# Patient Record
Sex: Female | Born: 1955 | Race: White | Hispanic: No | Marital: Married | Smoking: Current every day smoker
Health system: Southern US, Community
[De-identification: ages and names within clinical notes are randomized; demographics above are authoritative.]

## PROBLEM LIST (undated history)

## (undated) DIAGNOSIS — K219 Gastro-esophageal reflux disease without esophagitis: Secondary | ICD-10-CM

## (undated) DIAGNOSIS — M069 Rheumatoid arthritis, unspecified: Secondary | ICD-10-CM

## (undated) DIAGNOSIS — H409 Unspecified glaucoma: Secondary | ICD-10-CM

## (undated) DIAGNOSIS — M1990 Unspecified osteoarthritis, unspecified site: Secondary | ICD-10-CM

## (undated) DIAGNOSIS — R609 Edema, unspecified: Secondary | ICD-10-CM

## (undated) HISTORY — PX: TONSILLECTOMY: SUR1361

## (undated) HISTORY — PX: CARPAL TUNNEL RELEASE: SHX101

## (undated) HISTORY — PX: PARTIAL HYSTERECTOMY: SHX80

---

## 2000-05-29 ENCOUNTER — Other Ambulatory Visit: Admission: RE | Admit: 2000-05-29 | Discharge: 2000-05-29 | Payer: Self-pay | Admitting: Gynecology

## 2001-04-16 ENCOUNTER — Encounter: Payer: Self-pay | Admitting: Family Medicine

## 2001-04-16 ENCOUNTER — Encounter: Admission: RE | Admit: 2001-04-16 | Discharge: 2001-04-16 | Payer: Self-pay | Admitting: Family Medicine

## 2002-03-02 ENCOUNTER — Encounter: Payer: Self-pay | Admitting: Family Medicine

## 2002-03-02 ENCOUNTER — Encounter: Admission: RE | Admit: 2002-03-02 | Discharge: 2002-03-02 | Payer: Self-pay | Admitting: Family Medicine

## 2002-09-20 ENCOUNTER — Encounter: Payer: Self-pay | Admitting: Family Medicine

## 2002-09-20 ENCOUNTER — Encounter: Admission: RE | Admit: 2002-09-20 | Discharge: 2002-09-20 | Payer: Self-pay | Admitting: Family Medicine

## 2003-11-07 ENCOUNTER — Other Ambulatory Visit: Admission: RE | Admit: 2003-11-07 | Discharge: 2003-11-07 | Payer: Self-pay | Admitting: Gynecology

## 2006-08-22 ENCOUNTER — Emergency Department (HOSPITAL_COMMUNITY): Admission: EM | Admit: 2006-08-22 | Discharge: 2006-08-22 | Payer: Self-pay | Admitting: Emergency Medicine

## 2007-02-04 ENCOUNTER — Encounter: Admission: RE | Admit: 2007-02-04 | Discharge: 2007-02-04 | Payer: Self-pay | Admitting: Family Medicine

## 2007-06-17 ENCOUNTER — Emergency Department (HOSPITAL_COMMUNITY): Admission: EM | Admit: 2007-06-17 | Discharge: 2007-06-17 | Payer: Self-pay | Admitting: Family Medicine

## 2010-01-08 ENCOUNTER — Ambulatory Visit
Admission: RE | Admit: 2010-01-08 | Discharge: 2010-01-08 | Payer: Self-pay | Source: Home / Self Care | Attending: Unknown Physician Specialty | Admitting: Unknown Physician Specialty

## 2010-01-23 ENCOUNTER — Ambulatory Visit (HOSPITAL_COMMUNITY)
Admission: RE | Admit: 2010-01-23 | Discharge: 2010-01-23 | Payer: Self-pay | Source: Home / Self Care | Attending: Orthopedic Surgery | Admitting: Orthopedic Surgery

## 2010-03-05 ENCOUNTER — Other Ambulatory Visit: Payer: Self-pay | Admitting: Rheumatology

## 2010-03-05 ENCOUNTER — Ambulatory Visit
Admission: RE | Admit: 2010-03-05 | Discharge: 2010-03-05 | Disposition: A | Discharge status: Home / Self Care | Payer: 59 | Source: Ambulatory Visit | Attending: Rheumatology | Admitting: Rheumatology

## 2010-03-05 DIAGNOSIS — M1990 Unspecified osteoarthritis, unspecified site: Secondary | ICD-10-CM

## 2010-03-05 MED ORDER — GADOBENATE DIMEGLUMINE 529 MG/ML IV SOLN
10.0000 mL | Freq: Once | INTRAVENOUS | Status: AC | PRN
  Administered 2010-03-05: 10 mL via INTRAVENOUS

## 2010-04-10 LAB — POCT HEMOGLOBIN-HEMACUE: Hemoglobin: 15.4 g/dL — ABNORMAL HIGH (ref 12.0–15.0)

## 2010-11-12 LAB — POCT URINALYSIS DIP (DEVICE)
Bilirubin Urine: NEGATIVE
Glucose, UA: NEGATIVE
Hgb urine dipstick: NEGATIVE
Ketones, ur: NEGATIVE
Nitrite: NEGATIVE
Operator id: 208841
Protein, ur: NEGATIVE
Specific Gravity, Urine: 1.015
Urobilinogen, UA: 0.2
pH: 6.5

## 2011-07-17 HISTORY — PX: KNEE ARTHROSCOPY: SUR90

## 2011-08-29 ENCOUNTER — Other Ambulatory Visit: Payer: Self-pay | Admitting: Orthopedic Surgery

## 2011-08-30 ENCOUNTER — Encounter (HOSPITAL_COMMUNITY): Payer: Self-pay | Admitting: Pharmacy Technician

## 2011-09-04 ENCOUNTER — Encounter (HOSPITAL_COMMUNITY)
Admission: RE | Admit: 2011-09-04 | Discharge: 2011-09-04 | Disposition: A | Discharge status: Home / Self Care | Payer: 59 | Source: Ambulatory Visit | Attending: Orthopedic Surgery | Admitting: Orthopedic Surgery

## 2011-09-04 ENCOUNTER — Encounter (HOSPITAL_COMMUNITY): Payer: Self-pay

## 2011-09-04 HISTORY — DX: Gastro-esophageal reflux disease without esophagitis: K21.9

## 2011-09-04 HISTORY — DX: Edema, unspecified: R60.9

## 2011-09-04 HISTORY — DX: Unspecified osteoarthritis, unspecified site: M19.90

## 2011-09-04 HISTORY — DX: Rheumatoid arthritis, unspecified: M06.9

## 2011-09-04 LAB — CBC WITH DIFFERENTIAL/PLATELET
Basophils Absolute: 0 10*3/uL (ref 0.0–0.1)
Basophils Relative: 0 % (ref 0–1)
Eosinophils Absolute: 0.1 10*3/uL (ref 0.0–0.7)
Eosinophils Relative: 2 % (ref 0–5)
HCT: 43.9 % (ref 36.0–46.0)
Hemoglobin: 15.4 g/dL — ABNORMAL HIGH (ref 12.0–15.0)
Lymphocytes Relative: 22 % (ref 12–46)
Lymphs Abs: 1.9 10*3/uL (ref 0.7–4.0)
MCH: 33.9 pg (ref 26.0–34.0)
MCHC: 35.1 g/dL (ref 30.0–36.0)
MCV: 96.7 fL (ref 78.0–100.0)
Monocytes Absolute: 0.5 10*3/uL (ref 0.1–1.0)
Monocytes Relative: 6 % (ref 3–12)
Neutro Abs: 6.1 10*3/uL (ref 1.7–7.7)
Neutrophils Relative %: 71 % (ref 43–77)
Platelets: 315 10*3/uL (ref 150–400)
RBC: 4.54 MIL/uL (ref 3.87–5.11)
RDW: 13.7 % (ref 11.5–15.5)
WBC: 8.6 10*3/uL (ref 4.0–10.5)

## 2011-09-04 LAB — URINALYSIS, ROUTINE W REFLEX MICROSCOPIC
Bilirubin Urine: NEGATIVE
Glucose, UA: NEGATIVE mg/dL
Hgb urine dipstick: NEGATIVE
Ketones, ur: NEGATIVE mg/dL
Leukocytes, UA: NEGATIVE
Nitrite: NEGATIVE
Protein, ur: NEGATIVE mg/dL
Specific Gravity, Urine: 1.008 (ref 1.005–1.030)
Urobilinogen, UA: 0.2 mg/dL (ref 0.0–1.0)
pH: 7 (ref 5.0–8.0)

## 2011-09-04 LAB — BASIC METABOLIC PANEL
BUN: 10 mg/dL (ref 6–23)
CO2: 27 mEq/L (ref 19–32)
Calcium: 9.9 mg/dL (ref 8.4–10.5)
Chloride: 97 mEq/L (ref 96–112)
Creatinine, Ser: 0.62 mg/dL (ref 0.50–1.10)
GFR calc Af Amer: >90 mL/min (ref >90)
GFR calc non Af Amer: >90 mL/min (ref >90)
Glucose, Bld: 103 mg/dL — ABNORMAL HIGH (ref 70–99)
Potassium: 3 mEq/L — ABNORMAL LOW (ref 3.5–5.1)
Sodium: 135 mEq/L (ref 135–145)

## 2011-09-04 LAB — PROTIME-INR
INR: 0.98 (ref 0.00–1.49)
Prothrombin Time: 13.2 seconds (ref 11.6–15.2)

## 2011-09-04 LAB — TYPE AND SCREEN
ABO/RH(D): A POS
Antibody Screen: NEGATIVE

## 2011-09-04 LAB — APTT: aPTT: 27 seconds (ref 24–37)

## 2011-09-04 LAB — SURGICAL PCR SCREEN
MRSA, PCR: NEGATIVE
Staphylococcus aureus: NEGATIVE

## 2011-09-04 LAB — ABO/RH: ABO/RH(D): A POS

## 2011-09-04 NOTE — Pre-Procedure Instructions (Signed)
19 Katelyn Bell  09/04/2011   Your procedure is scheduled on:  Wednesday September 11, 2011.  Report to [facility] at 1045 AM.  Call this number if you have problems the morning of surgery: [phone]   Remember:   Do not eat food or drink:After Midnight.    Take these medicines the morning of surgery with A SIP OF WATER: Omeprazole (Prilosec), Astelin, Premarin (if due)     STOP Vitamin B, Oscal, Cinnamon, Fish Oil, Folic acid, Magnesium, Vitamin C, Vitamin E today  Do not wear jewelry, make-up or nail polish.  Do not wear lotions, powders, or perfumes.   Do not shave 48 hours prior to surgery.   Do not bring valuables to the hospital.  Contacts, dentures or bridgework may not be worn into surgery.  Leave suitcase in the car. After surgery it may be brought to your room.  For patients admitted to the hospital, checkout time is 11:00 AM the day of discharge.   Special Instructions: Incentive Spirometry - Practice and bring it with you on the day of surgery. and CHG Shower Use Special Wash: 1/2 bottle night before surgery and 1/2 bottle morning of surgery.   Please read over the following fact sheets that you were given: Pain Booklet, Coughing and Deep Breathing, Blood Transfusion Information, Total Joint Packet, MRSA Information and Surgical Site Infection Prevention

## 2011-09-05 NOTE — Consult Note (Addendum)
Anesthesia Chart Review:  Patient is a 56 year old female scheduled for a right TKA on 09/11/11 by Dr. Turner Daniels.  Her PCP is Dr. Lupita Raider.  History includes smoking, RA on methotrexate, GERD, OA, "fluid retention" with normal EF by echo in '11.  She is s/p CTR, partial hysterectomy, tonsillectomy.  She underwent right knee arthroscopy in June 2013 at an out-patient surgical center.  CXR on 09/04/11 showed no evidence of acute cardiopulmonary disease.  Labs noted.  Na 135, K 3.0, glucose 103,  H/H 15.4/43.9, PLT 315, coags and UA WNL.  T&S done.  EKG on 09/04/11 showed NSR, possible LAE, low voltage QRS, inferior infarct (age undetermined), cannot rule out anterior infarct (age undetermined).  Overall, I think her EKG is stable since 02/04/08 (Dr. Clelia Croft). Comments indicate that her 2010 EKG was felt stable since 2004 (office felt copy was too light to fax).  She is not followed by a Cardiologist, but she did have an echo at [facility] on 10/12/09 that showed normal LV wall thickness, size, and contraction, EF 62.6%, trace MR, grade 1 diastolic dysfunction without elevated LA pressure.  Her EKG is stable since at least January 2010, she had a fairly normal echo in September 2011, and tolerated a knee arthroscopy in June of this year.  No CV symptoms were documented at her PAT appointment, and she has no known personal history of CAD/MI or DM.  If she remains asymptomatic, then anticipate she can proceed as planned.  Shonna Chock, PA-C

## 2011-09-10 MED ORDER — VANCOMYCIN HCL IN DEXTROSE 1-5 GM/200ML-% IV SOLN
1000.0000 mg | INTRAVENOUS | Status: AC
  Administered 2011-09-11: 1000 mg via INTRAVENOUS
  Filled 2011-09-10: qty 200

## 2011-09-10 NOTE — H&P (Signed)
Katelyn Bell is an 56 y.o. female.   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  Surgery was 6 weeks ago and the patient continues to have severe disabling pain that wakes her up at night, prevents her from doing chores around the house and limits walking to only about 50-100 feet.  Prior to the arthroscopic surgery she had multiple cortisone injections, the last few failed early, as well as Visco supplementation the did not help.  She is miserable.  Past Medical History  Diagnosis Date  . Rheumatoid arthritis   . Fluid retention   . GERD (gastroesophageal reflux disease)   . Osteoarthritis     Past Surgical History  Procedure Date  . Knee arthroscopy 07/17/11    right  . Partial hysterectomy   . Carpal tunnel release ~ 2 years    bilateral  . Tonsillectomy     No family history on file. Social History:  reports that she has been smoking.  She does not have any smokeless tobacco history on file. She reports that she does not drink alcohol or use illicit drugs.  Allergies:  Allergies  Allergen Reactions  . Penicillins Hives    No prescriptions prior to admission    No results found for this or any previous visit (from the past 48 hour(s)). No results found.  Review of Systems  Musculoskeletal: Positive for joint pain.  All other systems reviewed and are negative.    There were no vitals taken for this visit. Physical Exam  Constitutional: She is oriented to person, place, and time. She appears well-developed and well-nourished.  HENT:  Head: Normocephalic.  Eyes: Pupils are equal, round, and reactive to light.  Neck: Neck supple.  Cardiovascular: Normal rate and regular rhythm.   Respiratory: Breath sounds normal.  GI: Bowel sounds are normal.  Musculoskeletal: She  exhibits tenderness.  Neurological: She is alert and oriented to person, place, and time.  Psychiatric: She has a normal mood and affect.     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  I would like to proceed with total knee arthroplasty.  Risks and benefits of surgery been discussed at length.  Patient will probably in the hospital 2-4 days and then go home or she has good help.  In the meantime, she'll continue to use hydrocodone 5 mg by mouth every 6-8 hours when necessary.  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  Jeet Shough M. 09/10/2011, 9:04 AM

## 2011-09-11 ENCOUNTER — Inpatient Hospital Stay (HOSPITAL_COMMUNITY): Payer: 59 | Admitting: Vascular Surgery

## 2011-09-11 ENCOUNTER — Encounter (HOSPITAL_COMMUNITY): Payer: Self-pay | Admitting: *Deleted

## 2011-09-11 ENCOUNTER — Encounter (HOSPITAL_COMMUNITY): Payer: Self-pay | Admitting: Anesthesiology

## 2011-09-11 ENCOUNTER — Encounter (HOSPITAL_COMMUNITY)
Admission: RE | Disposition: A | Discharge status: Home / Self Care | Payer: Self-pay | Source: Ambulatory Visit | Attending: Orthopedic Surgery

## 2011-09-11 ENCOUNTER — Encounter (HOSPITAL_COMMUNITY): Payer: Self-pay | Admitting: Vascular Surgery

## 2011-09-11 ENCOUNTER — Inpatient Hospital Stay (HOSPITAL_COMMUNITY)
Admission: RE | Admit: 2011-09-11 | Discharge: 2011-09-15 | DRG: 470 | Disposition: A | Discharge status: Home / Self Care | Payer: 59 | Source: Ambulatory Visit | Attending: Orthopedic Surgery | Admitting: Orthopedic Surgery

## 2011-09-11 DIAGNOSIS — F17200 Nicotine dependence, unspecified, uncomplicated: Secondary | ICD-10-CM | POA: Diagnosis present

## 2011-09-11 DIAGNOSIS — M1711 Unilateral primary osteoarthritis, right knee: Secondary | ICD-10-CM | POA: Diagnosis present

## 2011-09-11 DIAGNOSIS — Z880 Allergy status to penicillin: Secondary | ICD-10-CM

## 2011-09-11 DIAGNOSIS — M069 Rheumatoid arthritis, unspecified: Secondary | ICD-10-CM | POA: Diagnosis present

## 2011-09-11 DIAGNOSIS — M1710 Unilateral primary osteoarthritis, unspecified knee: Principal | ICD-10-CM | POA: Diagnosis present

## 2011-09-11 DIAGNOSIS — K219 Gastro-esophageal reflux disease without esophagitis: Secondary | ICD-10-CM | POA: Diagnosis present

## 2011-09-11 DIAGNOSIS — M2240 Chondromalacia patellae, unspecified knee: Secondary | ICD-10-CM | POA: Diagnosis present

## 2011-09-11 DIAGNOSIS — Z90710 Acquired absence of both cervix and uterus: Secondary | ICD-10-CM

## 2011-09-11 HISTORY — PX: TOTAL KNEE ARTHROPLASTY: SHX125

## 2011-09-11 SURGERY — ARTHROPLASTY, KNEE, TOTAL
Anesthesia: General | Site: Knee | Laterality: Right | Wound class: Clean

## 2011-09-11 MED ORDER — KCL IN DEXTROSE-NACL 20-5-0.45 MEQ/L-%-% IV SOLN
INTRAVENOUS | Status: DC
  Administered 2011-09-11 – 2011-09-12 (×3): via INTRAVENOUS
  Filled 2011-09-11 (×15): qty 1000

## 2011-09-11 MED ORDER — HYDROMORPHONE HCL PF 1 MG/ML IJ SOLN
1.0000 mg | INTRAMUSCULAR | Status: DC | PRN
  Administered 2011-09-11 – 2011-09-13 (×10): 1 mg via INTRAVENOUS
  Filled 2011-09-11 (×9): qty 1

## 2011-09-11 MED ORDER — MIDAZOLAM HCL 5 MG/5ML IJ SOLN
INTRAMUSCULAR | Status: DC | PRN
  Administered 2011-09-11: 1 mg via INTRAVENOUS

## 2011-09-11 MED ORDER — METHOTREXATE 2.5 MG PO TABS
25.0000 mg | ORAL_TABLET | ORAL | Status: DC
  Filled 2011-09-11: qty 10

## 2011-09-11 MED ORDER — LORATADINE 10 MG PO TABS
10.0000 mg | ORAL_TABLET | Freq: Every day | ORAL | Status: DC
  Administered 2011-09-12 – 2011-09-15 (×4): 10 mg via ORAL
  Filled 2011-09-11 (×5): qty 1

## 2011-09-11 MED ORDER — MIDAZOLAM HCL 2 MG/2ML IJ SOLN
1.0000 mg | INTRAMUSCULAR | Status: DC | PRN
  Administered 2011-09-11: 2 mg via INTRAVENOUS

## 2011-09-11 MED ORDER — LACTATED RINGERS IV SOLN
INTRAVENOUS | Status: DC
  Administered 2011-09-11: 13:00:00 via INTRAVENOUS

## 2011-09-11 MED ORDER — SODIUM CHLORIDE 0.9 % IR SOLN
Status: DC | PRN
  Administered 2011-09-11: 800 mL

## 2011-09-11 MED ORDER — CHLORHEXIDINE GLUCONATE 4 % EX LIQD: 60.0000 mL | Freq: Once | CUTANEOUS | Status: DC

## 2011-09-11 MED ORDER — KCL IN DEXTROSE-NACL 20-5-0.45 MEQ/L-%-% IV SOLN
INTRAVENOUS | Status: AC
  Filled 2011-09-11: qty 1000

## 2011-09-11 MED ORDER — CEFUROXIME SODIUM 1.5 G IJ SOLR
INTRAMUSCULAR | Status: AC
  Filled 2011-09-11: qty 1.5

## 2011-09-11 MED ORDER — PANTOPRAZOLE SODIUM 40 MG PO TBEC
40.0000 mg | DELAYED_RELEASE_TABLET | Freq: Every day | ORAL | Status: DC
  Administered 2011-09-12 – 2011-09-15 (×4): 40 mg via ORAL
  Filled 2011-09-11 (×4): qty 1

## 2011-09-11 MED ORDER — ACETAMINOPHEN 10 MG/ML IV SOLN
15.0000 mg/kg | Freq: Four times a day (QID) | INTRAVENOUS | Status: AC
  Administered 2011-09-11 – 2011-09-12 (×4): 843 mg via INTRAVENOUS
  Filled 2011-09-11 (×4): qty 84.3

## 2011-09-11 MED ORDER — METOCLOPRAMIDE HCL 5 MG/ML IJ SOLN
5.0000 mg | Freq: Three times a day (TID) | INTRAMUSCULAR | Status: DC | PRN
Start: 2011-09-11 — End: 2011-09-15
  Administered 2011-09-11: 10 mg via INTRAVENOUS
  Filled 2011-09-11: qty 2

## 2011-09-11 MED ORDER — MIDAZOLAM HCL 2 MG/2ML IJ SOLN
INTRAMUSCULAR | Status: AC
  Filled 2011-09-11: qty 2

## 2011-09-11 MED ORDER — ESTROGENS CONJUGATED 0.9 MG PO TABS
0.9000 mg | ORAL_TABLET | Freq: Every day | ORAL | Status: DC
  Administered 2011-09-11: 0.9 mg via ORAL
  Filled 2011-09-11 (×2): qty 1

## 2011-09-11 MED ORDER — MENTHOL 3 MG MT LOZG: 1.0000 | LOZENGE | OROMUCOSAL | Status: DC | PRN

## 2011-09-11 MED ORDER — HYDROCHLOROTHIAZIDE 25 MG PO TABS
25.0000 mg | ORAL_TABLET | Freq: Every day | ORAL | Status: DC
  Administered 2011-09-11 – 2011-09-15 (×5): 25 mg via ORAL
  Filled 2011-09-11 (×5): qty 1

## 2011-09-11 MED ORDER — PHENOL 1.4 % MT LIQD: 1.0000 | OROMUCOSAL | Status: DC | PRN

## 2011-09-11 MED ORDER — CEFUROXIME SODIUM 1.5 G IJ SOLR
INTRAMUSCULAR | Status: DC | PRN
  Administered 2011-09-11: 1.5 g

## 2011-09-11 MED ORDER — ONDANSETRON HCL 4 MG/2ML IJ SOLN: 4.0000 mg | Freq: Four times a day (QID) | INTRAMUSCULAR | Status: DC | PRN

## 2011-09-11 MED ORDER — FLEET ENEMA 7-19 GM/118ML RE ENEM: 1.0000 | ENEMA | Freq: Once | RECTAL | Status: AC | PRN

## 2011-09-11 MED ORDER — HYDROMORPHONE HCL PF 1 MG/ML IJ SOLN
INTRAMUSCULAR | Status: AC
  Filled 2011-09-11: qty 1

## 2011-09-11 MED ORDER — ONDANSETRON HCL 4 MG PO TABS: 4.0000 mg | ORAL_TABLET | Freq: Four times a day (QID) | ORAL | Status: DC | PRN

## 2011-09-11 MED ORDER — LACTATED RINGERS IV SOLN
INTRAVENOUS | Status: DC | PRN
  Administered 2011-09-11 (×2): via INTRAVENOUS

## 2011-09-11 MED ORDER — DEXTROSE-NACL 5-0.45 % IV SOLN: INTRAVENOUS | Status: DC

## 2011-09-11 MED ORDER — ACETAMINOPHEN 10 MG/ML IV SOLN
INTRAVENOUS | Status: AC
  Administered 2011-09-11: 1000 mg
  Filled 2011-09-11: qty 100

## 2011-09-11 MED ORDER — ALUM & MAG HYDROXIDE-SIMETH 200-200-20 MG/5ML PO SUSP: 30.0000 mL | ORAL | Status: DC | PRN

## 2011-09-11 MED ORDER — METHOCARBAMOL 100 MG/ML IJ SOLN
500.0000 mg | INTRAVENOUS | Status: AC
  Administered 2011-09-11: 500 mg via INTRAVENOUS
  Filled 2011-09-11: qty 5

## 2011-09-11 MED ORDER — LIDOCAINE HCL (CARDIAC) 20 MG/ML IV SOLN
INTRAVENOUS | Status: DC | PRN
  Administered 2011-09-11: 60 mg via INTRAVENOUS

## 2011-09-11 MED ORDER — ACETAMINOPHEN 650 MG RE SUPP: 650.0000 mg | Freq: Four times a day (QID) | RECTAL | Status: DC | PRN

## 2011-09-11 MED ORDER — PROPOFOL 10 MG/ML IV EMUL
INTRAVENOUS | Status: DC | PRN
  Administered 2011-09-11: 200 mg via INTRAVENOUS

## 2011-09-11 MED ORDER — ASPIRIN EC 325 MG PO TBEC
325.0000 mg | DELAYED_RELEASE_TABLET | Freq: Two times a day (BID) | ORAL | Status: DC
  Administered 2011-09-11 – 2011-09-15 (×8): 325 mg via ORAL
  Filled 2011-09-11 (×9): qty 1

## 2011-09-11 MED ORDER — DIPHENHYDRAMINE HCL 12.5 MG/5ML PO ELIX: 12.5000 mg | ORAL_SOLUTION | ORAL | Status: DC | PRN

## 2011-09-11 MED ORDER — ONDANSETRON HCL 4 MG/2ML IJ SOLN
4.0000 mg | Freq: Once | INTRAMUSCULAR | Status: AC | PRN
  Administered 2011-09-11: 4 mg via INTRAVENOUS

## 2011-09-11 MED ORDER — BUPIVACAINE-EPINEPHRINE PF 0.5-1:200000 % IJ SOLN
INTRAMUSCULAR | Status: DC | PRN
  Administered 2011-09-11: 30 mL

## 2011-09-11 MED ORDER — OXYCODONE HCL 5 MG PO TABS
5.0000 mg | ORAL_TABLET | ORAL | Status: DC | PRN
  Administered 2011-09-11: 10 mg via ORAL
  Administered 2011-09-12: 5 mg via ORAL
  Administered 2011-09-12 (×3): 10 mg via ORAL
  Administered 2011-09-12: 5 mg via ORAL
  Administered 2011-09-13 (×4): 10 mg via ORAL
  Administered 2011-09-13: 5 mg via ORAL
  Administered 2011-09-13 – 2011-09-14 (×2): 10 mg via ORAL
  Administered 2011-09-14 – 2011-09-15 (×6): 5 mg via ORAL
  Filled 2011-09-11 (×4): qty 2
  Filled 2011-09-11: qty 1
  Filled 2011-09-11 (×9): qty 2
  Filled 2011-09-11: qty 1
  Filled 2011-09-11: qty 2
  Filled 2011-09-11 (×2): qty 1

## 2011-09-11 MED ORDER — HYDROMORPHONE HCL PF 1 MG/ML IJ SOLN
0.2500 mg | INTRAMUSCULAR | Status: DC | PRN
  Administered 2011-09-11 (×4): 0.5 mg via INTRAVENOUS

## 2011-09-11 MED ORDER — AZELASTINE HCL 0.1 % NA SOLN
2.0000 | Freq: Every day | NASAL | Status: DC
  Administered 2011-09-11 – 2011-09-15 (×5): 2 via NASAL
  Filled 2011-09-11: qty 30

## 2011-09-11 MED ORDER — ACETAMINOPHEN 325 MG PO TABS: 650.0000 mg | ORAL_TABLET | Freq: Four times a day (QID) | ORAL | Status: DC | PRN

## 2011-09-11 MED ORDER — FENTANYL CITRATE 0.05 MG/ML IJ SOLN
INTRAMUSCULAR | Status: DC | PRN
  Administered 2011-09-11: 25 ug via INTRAVENOUS
  Administered 2011-09-11 (×2): 50 ug via INTRAVENOUS
  Administered 2011-09-11: 100 ug via INTRAVENOUS
  Administered 2011-09-11: 25 ug via INTRAVENOUS
  Administered 2011-09-11 (×3): 50 ug via INTRAVENOUS

## 2011-09-11 MED ORDER — FENTANYL CITRATE 0.05 MG/ML IJ SOLN
INTRAMUSCULAR | Status: AC
  Filled 2011-09-11: qty 2

## 2011-09-11 MED ORDER — FENTANYL CITRATE 0.05 MG/ML IJ SOLN
50.0000 ug | INTRAMUSCULAR | Status: DC | PRN
  Administered 2011-09-11: 100 ug via INTRAVENOUS

## 2011-09-11 MED ORDER — ONDANSETRON HCL 4 MG/2ML IJ SOLN
INTRAMUSCULAR | Status: AC
  Filled 2011-09-11: qty 2

## 2011-09-11 MED ORDER — METHOCARBAMOL 100 MG/ML IJ SOLN
500.0000 mg | Freq: Four times a day (QID) | INTRAVENOUS | Status: DC | PRN
  Filled 2011-09-11: qty 5

## 2011-09-11 MED ORDER — METHOCARBAMOL 500 MG PO TABS
500.0000 mg | ORAL_TABLET | Freq: Four times a day (QID) | ORAL | Status: DC | PRN
  Administered 2011-09-11 – 2011-09-15 (×11): 500 mg via ORAL
  Filled 2011-09-11 (×11): qty 1

## 2011-09-11 MED ORDER — MAGNESIUM HYDROXIDE 400 MG/5ML PO SUSP: 30.0000 mL | Freq: Every day | ORAL | Status: DC | PRN

## 2011-09-11 MED ORDER — BISACODYL 5 MG PO TBEC: 5.0000 mg | DELAYED_RELEASE_TABLET | Freq: Every day | ORAL | Status: DC | PRN

## 2011-09-11 MED ORDER — OXYCODONE HCL 5 MG PO TABS
ORAL_TABLET | ORAL | Status: AC
  Filled 2011-09-11: qty 1

## 2011-09-11 MED ORDER — OXYCODONE HCL 5 MG PO TABS
ORAL_TABLET | ORAL | Status: AC
  Administered 2011-09-11: 10 mg
  Filled 2011-09-11: qty 1

## 2011-09-11 MED ORDER — ONDANSETRON HCL 4 MG/2ML IJ SOLN
INTRAMUSCULAR | Status: DC | PRN
  Administered 2011-09-11: 4 mg via INTRAVENOUS

## 2011-09-11 MED ORDER — METOCLOPRAMIDE HCL 10 MG PO TABS: 5.0000 mg | ORAL_TABLET | Freq: Three times a day (TID) | ORAL | Status: DC | PRN

## 2011-09-11 SURGICAL SUPPLY — 56 items
BANDAGE ELASTIC 6 VELCRO ST LF (GAUZE/BANDAGES/DRESSINGS) ×2 IMPLANT
BANDAGE ESMARK 6X9 LF (GAUZE/BANDAGES/DRESSINGS) IMPLANT
BLADE SAG 18X100X1.27 (BLADE) ×2 IMPLANT
BLADE SAW SGTL 13X75X1.27 (BLADE) ×2 IMPLANT
BLADE SURG ROTATE 9660 (MISCELLANEOUS) IMPLANT
BNDG ELASTIC 6X10 VLCR STRL LF (GAUZE/BANDAGES/DRESSINGS) ×2 IMPLANT
BNDG ESMARK 6X9 LF (GAUZE/BANDAGES/DRESSINGS)
BOWL SMART MIX CTS (DISPOSABLE) ×2 IMPLANT
CEMENT HV SMART SET (Cement) ×4 IMPLANT
CLOTH BEACON ORANGE TIMEOUT ST (SAFETY) ×2 IMPLANT
COVER BACK TABLE 24X17X13 BIG (DRAPES) IMPLANT
COVER SURGICAL LIGHT HANDLE (MISCELLANEOUS) ×2 IMPLANT
CUFF TOURNIQUET SINGLE 34IN LL (TOURNIQUET CUFF) ×2 IMPLANT
CUFF TOURNIQUET SINGLE 44IN (TOURNIQUET CUFF) IMPLANT
DRAPE EXTREMITY T 121X128X90 (DRAPE) ×2 IMPLANT
DRAPE U-SHAPE 47X51 STRL (DRAPES) ×2 IMPLANT
DURAPREP 26ML APPLICATOR (WOUND CARE) ×2 IMPLANT
ELECT REM PT RETURN 9FT ADLT (ELECTROSURGICAL) ×2
ELECTRODE REM PT RTRN 9FT ADLT (ELECTROSURGICAL) ×1 IMPLANT
EVACUATOR 1/8 PVC DRAIN (DRAIN) ×2 IMPLANT
GAUZE XEROFORM 1X8 LF (GAUZE/BANDAGES/DRESSINGS) ×2 IMPLANT
GLOVE BIO SURGEON STRL SZ 6.5 (GLOVE) ×2 IMPLANT
GLOVE BIO SURGEON STRL SZ7 (GLOVE) ×2 IMPLANT
GLOVE BIO SURGEON STRL SZ7.5 (GLOVE) ×2 IMPLANT
GLOVE BIOGEL PI IND STRL 6.5 (GLOVE) ×1 IMPLANT
GLOVE BIOGEL PI IND STRL 7.0 (GLOVE) ×2 IMPLANT
GLOVE BIOGEL PI IND STRL 8 (GLOVE) ×2 IMPLANT
GLOVE BIOGEL PI INDICATOR 6.5 (GLOVE) ×1
GLOVE BIOGEL PI INDICATOR 7.0 (GLOVE) ×2
GLOVE BIOGEL PI INDICATOR 8 (GLOVE) ×2
GLOVE SURG SS PI 7.5 STRL IVOR (GLOVE) ×2 IMPLANT
GOWN PREVENTION PLUS XLARGE (GOWN DISPOSABLE) ×2 IMPLANT
GOWN STRL NON-REIN LRG LVL3 (GOWN DISPOSABLE) ×6 IMPLANT
HANDPIECE INTERPULSE COAX TIP (DISPOSABLE) ×1
HOOD PEEL AWAY FACE SHEILD DIS (HOOD) ×6 IMPLANT
KIT BASIN OR (CUSTOM PROCEDURE TRAY) ×2 IMPLANT
KIT ROOM TURNOVER OR (KITS) ×2 IMPLANT
MANIFOLD NEPTUNE II (INSTRUMENTS) ×2 IMPLANT
NS IRRIG 1000ML POUR BTL (IV SOLUTION) IMPLANT
PACK TOTAL JOINT (CUSTOM PROCEDURE TRAY) ×2 IMPLANT
PAD ARMBOARD 7.5X6 YLW CONV (MISCELLANEOUS) ×4 IMPLANT
PADDING CAST COTTON 6X4 STRL (CAST SUPPLIES) IMPLANT
SET HNDPC FAN SPRY TIP SCT (DISPOSABLE) ×1 IMPLANT
SPONGE GAUZE 4X4 12PLY (GAUZE/BANDAGES/DRESSINGS) ×2 IMPLANT
STAPLER VISISTAT 35W (STAPLE) ×2 IMPLANT
SUCTION FRAZIER TIP 10 FR DISP (SUCTIONS) ×2 IMPLANT
SUT VIC AB 0 CTX 36 (SUTURE) ×1
SUT VIC AB 0 CTX36XBRD ANTBCTR (SUTURE) ×1 IMPLANT
SUT VIC AB 1 CTX 36 (SUTURE) ×1
SUT VIC AB 1 CTX36XBRD ANBCTR (SUTURE) ×1 IMPLANT
SUT VIC AB 2-0 CT1 27 (SUTURE) ×1
SUT VIC AB 2-0 CT1 TAPERPNT 27 (SUTURE) ×1 IMPLANT
TOWEL OR 17X24 6PK STRL BLUE (TOWEL DISPOSABLE) ×2 IMPLANT
TOWEL OR 17X26 10 PK STRL BLUE (TOWEL DISPOSABLE) ×2 IMPLANT
TRAY FOLEY CATH 14FR (SET/KITS/TRAYS/PACK) ×2 IMPLANT
WATER STERILE IRR 1000ML POUR (IV SOLUTION) ×6 IMPLANT

## 2011-09-11 NOTE — Transfer of Care (Signed)
Immediate Anesthesia Transfer of Care Note  Patient: Katelyn Bell  Procedure(s) Performed: Procedure(s) (LRB): TOTAL KNEE ARTHROPLASTY (Right)  Patient Location: PACU  Anesthesia Type: GA combined with regional for post-op pain  Level of Consciousness: awake, alert  and oriented  Airway & Oxygen Therapy: Patient Spontanous Breathing and Patient connected to nasal cannula oxygen  Post-op Assessment: Report given to PACU RN  Post vital signs: Reviewed and stable  Complications: No apparent anesthesia complications

## 2011-09-11 NOTE — Progress Notes (Signed)
Orthopedic Tech Progress Note Patient Details:  Katelyn Bell 09/15/1955 [id number]  CPM Right Knee CPM Right Knee: On Right Knee Flexion (Degrees): 60  Right Knee Extension (Degrees): 0    Cathyann Kilfoyle T 09/11/2011, 4:03 PM

## 2011-09-11 NOTE — Anesthesia Preprocedure Evaluation (Signed)
Anesthesia Evaluation  Patient identified by MRN, date of birth, ID band Patient awake    Reviewed: Allergy & Precautions, H&P , NPO status , Patient's Chart, lab work & pertinent test results  Airway Mallampati: I TM Distance: >3 FB Neck ROM: Full    Dental  (+) Teeth Intact and Dental Advisory Given   Pulmonary  breath sounds clear to auscultation        Cardiovascular Rhythm:Regular Rate:Normal     Neuro/Psych    GI/Hepatic GERD-  Medicated and Controlled,  Endo/Other    Renal/GU      Musculoskeletal   Abdominal   Peds  Hematology   Anesthesia Other Findings   Reproductive/Obstetrics                          Anesthesia Physical Anesthesia Plan  ASA: II  Anesthesia Plan: General   Post-op Pain Management:    Induction: Intravenous  Airway Management Planned: LMA  Additional Equipment:   Intra-op Plan:   Post-operative Plan: Extubation in OR  Informed Consent: I have reviewed the patients History and Physical, chart, labs and discussed the procedure including the risks, benefits and alternatives for the proposed anesthesia with the patient or authorized representative who has indicated his/her understanding and acceptance.   Dental advisory given  Plan Discussed with: CRNA, Anesthesiologist and Surgeon  Anesthesia Plan Comments:         Anesthesia Quick Evaluation  

## 2011-09-11 NOTE — Preoperative (Signed)
Beta Blockers   Reason not to administer Beta Blockers:Not Applicable 

## 2011-09-11 NOTE — Anesthesia Postprocedure Evaluation (Signed)
  Anesthesia Post-op Note  Patient: Katelyn Bell  Procedure(s) Performed: Procedure(s) (LRB): TOTAL KNEE ARTHROPLASTY (Right)  Patient Location: PACU  Anesthesia Type: GA combined with regional for post-op pain  Level of Consciousness: awake  Airway and Oxygen Therapy: Patient Spontanous Breathing and Patient connected to nasal cannula oxygen  Post-op Pain: mild  Post-op Assessment: Post-op Vital signs reviewed  Post-op Vital Signs: Reviewed  Complications: No apparent anesthesia complications

## 2011-09-11 NOTE — Interval H&P Note (Signed)
History and Physical Interval Note:  09/11/2011 1:33 PM  Katelyn Bell  has presented today for surgery, with the diagnosis of OSTEOARTHRITIS RIGHT KNEE  The various methods of treatment have been discussed with the patient and family. After consideration of risks, benefits and other options for treatment, the patient has consented to  Procedure(s) (LRB): TOTAL KNEE ARTHROPLASTY (Right) as a surgical intervention .  The patient's history has been reviewed, patient examined, no change in status, stable for surgery.  I have reviewed the patient's chart and labs.  Questions were answered to the patient's satisfaction.     Nestor Lewandowsky

## 2011-09-11 NOTE — Op Note (Signed)
PATIENT ID:      Katelyn Bell  MRN:     [id number] DOB/AGE:    57-26-1957 / 57 y.o..       OPERATIVE REPORT    DATE OF PROCEDURE:  09/11/2011       PREOPERATIVE DIAGNOSIS:   OSTEOARTHRITIS RIGHT KNEE      There is no height or weight on file to calculate BMI.                                                        POSTOPERATIVE DIAGNOSIS:   OSTEOARTHRITIS RIGHT KNEE                                                                      PROCEDURE:  Procedure(s): TOTAL KNEE ARTHROPLASTY Using Depuy Sigma RP implants #2.5R Femur, #2.5Tibia, 10mm sigma RP bearing, 32 Patella     SURGEON: Dilraj Killgore J    ASSISTANT:   Shirl Harris PA-C   (Present and scrubbed throughout the case, critical for assistance with exposure, retraction, instrumentation, and closure.)         ANESTHESIA: GET with Femoral Nerve Block  DRAINS: foley, 2 medium hemovac in knee   TOURNIQUET TIME:   COMPLICATIONS:  None     SPECIMENS: None   INDICATIONS FOR PROCEDURE: The patient has  OSTEOARTHRITIS RIGHT KNEE, neytral deformities, XR shows medial subchondral cysts. Patient has failed all conservative measures including anti-inflammatory medicines, narcotics, attempts at  exercise and weight loss, cortisone injections and viscosupplementation.  Risks and benefits of surgery have been discussed, questions answered.   DESCRIPTION OF PROCEDURE: The patient identified by armband, received  right femoral nerve block and IV antibiotics, in the holding area at [facility]. Patient taken to the operating room, appropriate anesthetic  monitors were attached General endotracheal anesthesia induced with  the patient in supine position, Foley catheter was inserted. Tourniquet  applied high to the operative thigh. Lateral post and foot positioner  applied to the table, the lower extremity was then prepped and draped  in usual sterile fashion from the ankle to the tourniquet. Time-out procedure was performed. The  limb was wrapped with an Esmarch bandage and the tourniquet inflated to 350 mmHg. We began the operation by making the anterior midline incision starting at handbreadth above the patella going over the patella 1 cm medial to and  4 cm distal to the tibial tubercle. Small bleeders in the skin and the  subcutaneous tissue identified and cauterized. Transverse retinaculum was incised and reflected medially and a medial parapatellar arthrotomy was accomplished. the patella was everted and theprepatellar fat pad resected. The superficial medial collateral  ligament was then elevated from anterior to posterior along the proximal  flare of the tibia and anterior half of the menisci resected. The knee was hyperflexed exposing bone on bone arthritis. Peripheral and notch osteophytes as well as the cruciate ligaments were then resected. We continued to  work our way around posteriorly along the proximal tibia, and externally  rotated the tibia subluxing it out from underneath the femur. A  McHale  retractor was placed through the notch and a lateral Hohmann retractor  placed, and we then drilled through the proximal tibia in line with the  axis of the tibia followed by an intramedullary guide rod and 2-degree  posterior slope cutting guide. The tibial cutting guide was pinned into place  allowing resection of 8 mm of bone medially and about 8 mm of bone  laterally because of her neutral deformity. Satisfied with the tibial resection, we then  entered the distal femur 2 mm anterior to the PCL origin with the  intramedullary guide rod and applied the distal femoral cutting guide  set at 11mm, with 5 degrees of valgus. This was pinned along the  epicondylar axis. At this point, the distal femoral cut was accomplished without difficulty. We then sized for a #2.5 femoral component and pinned the guide in 3 degrees of external rotation.The chamfer cutting guide was pinned into place. The anterior, posterior, and  chamfer cuts were accomplished without difficulty followed by  the Sigma RP box cutting guide and the box cut. We also removed posterior osteophytes from the posterior femoral condyles. At this  time, the knee was brought into full extension. We checked our  extension and flexion gaps and found them symmetric at 10mm.  The patella thickness measured at 22 mm. We set the cutting guide at 14 and removed the posterior 9.5-10 mm  of the patella sized for 32 button and drilled the lollipop. The knee  was then once again hyperflexed exposing the proximal tibia. We sized for a #2.5 tibial base plate, applied the smokestack and the conical reamer followed by the the Delta fin keel punch. We then hammered into place the Sigma RP trial femoral component, inserted a 10-mm trial bearing, trial patellar button, and took the knee through range of motion from 0-130 degrees. No thumb pressure was required for patellar  tracking. At this point, all trial components were removed, a double batch of DePuy HV cement with 1500 mg of Zinacef was mixed and applied to all bony metallic mating surfaces except for the posterior condyles of the femur itself. In order, we  hammered into place the tibial tray and removed excess cement, the femoral component and removed excess cement, a 10-mm Sigma RP bearing  was inserted, and the knee brought to full extension with compression.  The patellar button was clamped into place, and excess cement  removed. While the cement cured the wound was irrigated out with normal saline solution pulse lavage, and medium Hemovac drains were placed from an anterolateral  approach. Ligament stability and patellar tracking were checked and found to be excellent. The parapatellar arthrotomy was closed with  running #1 Vicryl suture. The subcutaneous tissue with 0 and 2-0 undyed  Vicryl suture, and the skin with skin staples. A dressing of Xeroform,  4 x 4, dressing sponges, Webril, and Ace wrap applied.  The patient  awakened, extubated, and taken to recovery room without difficulty.   Gean Birchwood J 09/11/2011, 3:04 PM

## 2011-09-11 NOTE — Anesthesia Procedure Notes (Signed)
Anesthesia Regional Block:  Femoral nerve block  Pre-Anesthetic Checklist: ,, timeout performed, Correct Patient, Correct Site, Correct Laterality, Correct Procedure, Correct Position, site marked, Risks and benefits discussed,  Surgical consent,  Pre-op evaluation,  At surgeon's request and post-op pain management  Laterality: Right and Lower  Prep: chloraprep       Needles:  Injection technique: Single-shot  Needle Type: Echogenic Needle     Needle Length: 9cm  Needle Gauge: 21    Additional Needles:  Procedures: ultrasound guided Femoral nerve block Narrative:  Start time: 09/11/2011 12:25 PM End time: 09/11/2011 12:34 PM Injection made incrementally with aspirations every 5 mL.  Performed by: Personally  Anesthesiologist: Sheldon Silvan, MD  Additional Notes: Marcaine 0.5% with EPI 1:200000  Femoral nerve block

## 2011-09-12 ENCOUNTER — Encounter (HOSPITAL_COMMUNITY): Payer: Self-pay | Admitting: Orthopedic Surgery

## 2011-09-12 LAB — BASIC METABOLIC PANEL
BUN: 5 mg/dL — ABNORMAL LOW (ref 6–23)
CO2: 26 mEq/L (ref 19–32)
Calcium: 8.1 mg/dL — ABNORMAL LOW (ref 8.4–10.5)
Chloride: 103 mEq/L (ref 96–112)
Creatinine, Ser: 0.59 mg/dL (ref 0.50–1.10)
GFR calc Af Amer: >90 mL/min (ref >90)
GFR calc non Af Amer: >90 mL/min (ref >90)
Glucose, Bld: 129 mg/dL — ABNORMAL HIGH (ref 70–99)
Potassium: 3.2 mEq/L — ABNORMAL LOW (ref 3.5–5.1)
Sodium: 137 mEq/L (ref 135–145)

## 2011-09-12 LAB — CBC
HCT: 34.6 % — ABNORMAL LOW (ref 36.0–46.0)
Hemoglobin: 11.6 g/dL — ABNORMAL LOW (ref 12.0–15.0)
MCH: 32.7 pg (ref 26.0–34.0)
MCHC: 33.5 g/dL (ref 30.0–36.0)
MCV: 97.5 fL (ref 78.0–100.0)
Platelets: 222 10*3/uL (ref 150–400)
RBC: 3.55 MIL/uL — ABNORMAL LOW (ref 3.87–5.11)
RDW: 13.5 % (ref 11.5–15.5)
WBC: 8.5 10*3/uL (ref 4.0–10.5)

## 2011-09-12 MED ORDER — ESTROGENS CONJUGATED 0.9 MG PO TABS
0.9000 mg | ORAL_TABLET | Freq: Every day | ORAL | Status: DC
  Administered 2011-09-12 – 2011-09-14 (×3): 0.9 mg via ORAL
  Filled 2011-09-12 (×5): qty 1

## 2011-09-12 NOTE — Progress Notes (Signed)
CARE MANAGEMENT NOTE 09/12/2011  Patient:  Katelyn Bell, Katelyn Bell   Account Number:  [id number]  Date Initiated:  09/12/2011  Documentation initiated by:  Vance Peper  Subjective/Objective Assessment:   56 yr old female s/p right total knee arthroplasty     Action/Plan:   CM spke with patient regarding home health needs at discharge. Unfortunately her husband suffered a stroke this am and she is dealing with that at present. Family will assist her at discharge. Preoperatively setup with [facility]. No change.   Anticipated DC Date:  09/13/2011   Anticipated DC Plan:  HOME W HOME HEALTH SERVICES      DC Planning Services  CM consult      [facility] Choice  HOME HEALTH   Choice offered to / List presented to:  [name]        HH arranged  HH-2 PT      [facility] agency  [facility]   Status of service:  In process, will continue to follow Medicare Important Message given?   (If response is "NO", the following Medicare IM given date fields will be blank) Date Medicare IM given:   Date Additional Medicare IM given:    Discharge Disposition:  HOME W HOME HEALTH SERVICES  Per UR Regulation:    If discussed at Long Length of Stay Meetings, dates discussed:    Comments:

## 2011-09-12 NOTE — Evaluation (Signed)
Occupational Therapy Evaluation Patient Details Name: Katelyn Bell MRN: [id number] DOB: [birth year] Today's Date: 09/12/2011 Time: 2952-8413 OT Time Calculation (min): 39 min  OT Assessment / Plan / Recommendation Clinical Impression  Pleasant 56 yr old female admitted for elective R TKA.  Presents with slight increase in dependence with basic selfcare tasks compared to PLOF.  Will benefit from acute OT to help provide education on DME use as well as techniques to help increase overall independence  in order to return home with family and PRN assistance.    OT Assessment  Patient needs continued OT Services    Follow Up Recommendations  No OT follow up    Barriers to Discharge Decreased caregiver support    Equipment Recommendations  3 in 1 bedside comode    Recommendations for Other Services    Frequency  Min 2X/week    Precautions / Restrictions Precautions Precautions: Knee;Fall Restrictions Weight Bearing Restrictions: No RLE Weight Bearing: Weight bearing as tolerated   Pertinent Vitals/Pain Pain 3/10 in the right knee,  BP 120/62 after pt became dizzy and needed to lay down.    ADL  Eating/Feeding: Performed;Independent Where Assessed - Eating/Feeding: Edge of bed Grooming: Performed;Set up Where Assessed - Grooming: Unsupported sitting Upper Body Bathing: Simulated;Set up Where Assessed - Upper Body Bathing: Unsupported sitting Lower Body Bathing: Simulated;Minimal assistance Where Assessed - Lower Body Bathing: Supported sit to stand Upper Body Dressing: Simulated;Set up Where Assessed - Upper Body Dressing: Unsupported sitting Lower Body Dressing: Simulated;Minimal assistance Where Assessed - Lower Body Dressing: Unsupported sit to stand Toilet Transfer: Performed;Minimal assistance Toilet Transfer Method: Other (comment) (Ambulating to the bathroom with RW) Toilet Transfer Equipment: Comfort height toilet;Grab bars Toileting - Clothing Manipulation and  Hygiene: Performed;Supervision/safety Where Assessed - Engineer, mining and Hygiene: Sit on 3-in-1 or toilet Tub/Shower Transfer: Simulated;Minimal assistance Tub/Shower Transfer Method: Ambulating;Anterior-posterior Equipment Used: Rolling walker Transfers/Ambulation Related to ADLs: Pt overall able to ambulate wth min guard assist to and from the bathroom using the RW. ADL Comments: Pt with decreased flexibility for reaching her RLE to donn her gripper sock.  Needs education on dressing techniques after TKA.  Able to ambulate with min guard assist but needed min assist for sit to stand from the bed.  Became nauseas and light headed after standing for 3-4 mins during grooming task and simulated shower transfer.  Pt voicing concerns for her husband who suffered a stroke this am and underwent surgery today at the hospital.       OT Diagnosis: Generalized weakness;Acute pain  OT Problem List: Decreased strength;Decreased activity tolerance;Impaired balance (sitting and/or standing);Decreased knowledge of use of DME or AE;Pain OT Treatment Interventions: Self-care/ADL training;Therapeutic activities;DME and/or AE instruction;Balance training;Patient/family education   OT Goals Acute Rehab OT Goals OT Goal Formulation: With patient Time For Goal Achievement: 09/19/11 Potential to Achieve Goals: Good ADL Goals Pt Will Perform Grooming: with modified independence;Standing at sink ADL Goal: Grooming - Progress: Goal set today Pt Will Perform Lower Body Bathing: with supervision;Sit to stand from chair;Sit to stand from bed ADL Goal: Lower Body Bathing - Progress: Goal set today Pt Will Perform Lower Body Dressing: with supervision;Sit to stand from bed;Sit to stand from chair ADL Goal: Lower Body Dressing - Progress: Goal set today Pt Will Transfer to Toilet: with supervision;with DME;3-in-1 ADL Goal: Toilet Transfer - Progress: Goal set today Pt Will Perform Tub/Shower Transfer:  Shower transfer;with supervision;with DME;Anterior-posterior transfer;Ambulation ADL Goal: Tub/Shower Transfer - Progress: Goal set today  Visit Information  Last OT Received On: 09/12/11 Assistance Needed: +1    Subjective Data  Subjective: "My husband had a stroke this morning." Patient Stated Goal: Wants her husband to be OK, and to go see him when possible.   Prior Functioning  Vision/Perception  Home Living Lives With: Spouse Available Help at Discharge: Family Type of Home: Mobile home Home Access: Stairs to enter Secretary/administrator of Steps: 4 Entrance Stairs-Rails: Left;Right;Can reach both Home Layout: One level Bathroom Shower/Tub: Forensic scientist: Standard Bathroom Accessibility: Yes How Accessible: Accessible via walker Prior Function Level of Independence: Independent Able to Take Stairs?: Yes Driving: Yes Communication Communication: No difficulties Dominant Hand: Right   Vision - Assessment Eye Alignment: Within Functional Limits Vision Assessment: Vision not tested Perception Perception: Within Functional Limits Praxis Praxis: Intact  Cognition  Overall Cognitive Status: Appears within functional limits for tasks assessed/performed Arousal/Alertness: Awake/alert Orientation Level: Appears intact for tasks assessed Behavior During Session: [facility] for tasks performed    Extremity/Trunk Assessment Right Upper Extremity Assessment RUE ROM/Strength/Tone: Within functional levels RUE Sensation: WFL - Light Touch RUE Coordination: WFL - gross/fine motor Left Upper Extremity Assessment LUE ROM/Strength/Tone: Within functional levels LUE Sensation: WFL - Light Touch LUE Coordination: WFL - gross/fine motor Trunk Assessment Trunk Assessment: Normal   Mobility Bed Mobility Bed Mobility: Supine to Sit Supine to Sit: 4: Min assist;HOB elevated Sitting - Scoot to Edge of Bed: 4: Min assist Details for Bed Mobility Assistance:  Needs assistance with moving the LLE. Transfers Transfers: Sit to Stand Sit to Stand: 4: Min assist;Without upper extremity assist;From toilet      Balance Balance Balance Assessed: No  End of Session OT - End of Session Activity Tolerance: Patient limited by fatigue Patient left: in bed;with call bell/phone within reach;with family/visitor present;with nursing in room Nurse Communication: Mobility status     Amatullah Christy OTR/L Pager number [phone] 09/12/2011, 3:40 PM

## 2011-09-12 NOTE — Evaluation (Signed)
Physical Therapy Evaluation Patient Details Name: Katelyn Bell MRN: [id number] DOB: [birth year] Today's Date: 09/12/2011 Time: 1057-     PT Assessment / Plan / Recommendation Clinical Impression  Pt is a 56 y/o female s/p R TKA.  Pt will benefit from continued PT intervention to maximize functional mobilty.     PT Assessment  Patient needs continued PT services    Follow Up Recommendations  Home health PT;Supervision - Intermittent    Barriers to Discharge        Equipment Recommendations  Rolling walker with 5" wheels;3 in 1 bedside comode    Recommendations for Other Services     Frequency 7X/week    Precautions / Restrictions Precautions Precautions: Knee Precaution Booklet Issued: Yes (comment) Restrictions Weight Bearing Restrictions: Yes RLE Weight Bearing: Weight bearing as tolerated   Pertinent Vitals/Pain Pt reports pain 8/10 in Right knee.  Pt medicated for during session.        Mobility  Bed Mobility Bed Mobility: Supine to Sit;Sitting - Scoot to Edge of Bed Supine to Sit: 4: Min assist;HOB flat Sitting - Scoot to [place] of Bed: 4: Min assist Details for Bed Mobility Assistance: Cueing for technique assist for R LE>   Transfers Transfers: Sit to Stand;Stand to Sit Sit to Stand: 4: Min assist;From bed;With upper extremity assist Sit to Stand: Patient Percentage: 80% Stand to Sit: 4: Min assist;To chair/3-in-1;With upper extremity assist;With armrests Details for Transfer Assistance: Cueing for technique including hand and R LE positioning  Ambulation/Gait Ambulation/Gait Assistance: 4: Min assist Ambulation Distance (Feet): 20 Feet Assistive device: Rolling walker Ambulation/Gait Assistance Details: Cueing for sequencing, increased weight bearing on RLE, and right heel contact with floor.   Gait Pattern: Step-to pattern;Decreased stance time - right;Decreased dorsiflexion - right;Decreased hip/knee flexion - right;Decreased weight shift to right;Trunk  flexed Stairs: No Wheelchair Mobility Wheelchair Mobility: No    Exercises     PT Diagnosis: Difficulty walking;Generalized weakness;Acute pain  PT Problem List: Decreased strength;Decreased activity tolerance;Decreased range of motion;Decreased mobility;Decreased knowledge of use of DME;Decreased knowledge of precautions;Pain PT Treatment Interventions: DME instruction;Gait training;Stair training;Functional mobility training;Therapeutic activities;Therapeutic exercise;Patient/family education;Manual techniques   PT Goals Acute Rehab PT Goals PT Goal Formulation: With patient Time For Goal Achievement: 09/19/11 Potential to Achieve Goals: Good Pt will go Supine/Side to Sit: with supervision PT Goal: Supine/Side to Sit - Progress: Goal set today Pt will go Sit to Supine/Side: with supervision PT Goal: Sit to Supine/Side - Progress: Goal set today Pt will go Sit to Stand: with modified independence PT Goal: Sit to Stand - Progress: Goal set today Pt will go Stand to Sit: with modified independence PT Goal: Stand to Sit - Progress: Goal set today Pt will Transfer Bed to Chair/Chair to Bed: with modified independence PT Transfer Goal: Bed to Chair/Chair to Bed - Progress: Goal set today Pt will Ambulate: 51 - 150 feet;with supervision;with rolling walker PT Goal: Ambulate - Progress: Goal set today Pt will Go Up / Down Stairs: 3-5 stairs;with supervision;with rail(s) PT Goal: Up/Down Stairs - Progress: Goal set today Pt will Perform Home Exercise Program: with supervision, verbal cues required/provided PT Goal: Perform Home Exercise Program - Progress: Goal set today  Visit Information  Last PT Received On: 09/12/11    Subjective Data  Subjective: Agree to PT eval Patient Stated Goal: Walk without pain   Prior Functioning  Home Living Lives With: Spouse Available Help at Discharge: Family Type of Home: Mobile home Home Access: Stairs to enter Entrance  Stairs-Number of  Steps: 4 Entrance Stairs-Rails: Left;Right;Can reach both Home Layout: One level Bathroom Shower/Tub: Counselling psychologist: Yes How Accessible: Accessible via walker Prior Function Level of Independence: Independent Able to Take Stairs?: Yes Communication Communication: No difficulties    Cognition  Overall Cognitive Status: Appears within functional limits for tasks assessed/performed Arousal/Alertness: Awake/alert Orientation Level: Oriented X4 / Intact Behavior During Session: WFL for tasks performed Cognition - Other Comments: Pt's spouse had a CVA this morning pt upset.     Extremity/Trunk Assessment Right Lower Extremity Assessment RLE ROM/Strength/Tone: Deficits;Due to pain Left Lower Extremity Assessment LLE ROM/Strength/Tone: Within functional levels   Balance Balance Balance Assessed: No  End of Session PT - End of Session Equipment Utilized During Treatment: Gait belt Activity Tolerance: Patient limited by fatigue;Patient limited by pain Patient left: in chair;with call bell/phone within reach Nurse Communication: Mobility status CPM Right Knee CPM Right Knee: Off  GP     Caison Hearn 09/12/2011, 1:40 PM  Colson Barco L. Chris Narasimhan DPT [phone]

## 2011-09-12 NOTE — Progress Notes (Signed)
Patient ID: Katelyn Bell, female   DOB: 06-16-1955, 56 y.o.   MRN: [id number] PATIENT ID: Katelyn Bell  MRN: [id number]  DOB/AGE:  1955-10-12 / 56 y.o.  1 Day Post-Op Procedure(s) (LRB): TOTAL KNEE ARTHROPLASTY (Right)    PROGRESS NOTE Subjective: Patient is alert, oriented, no Nausea, no Vomiting, yes passing gas, no Bowel Movement. Taking PO well. Denies SOB, Chest or Calf Pain. Using Incentive Spirometer, PAS in place. Ambulate WBAT, CPM 0-30 Patient reports pain as 4 on 0-10 scale  .    Objective: Vital signs in last 24 hours: Filed Vitals:   09/12/11 0000 09/12/11 0210 09/12/11 0400 09/12/11 0608  BP:  106/53  95/52  Pulse:  76  75  Temp:  97.7 F (36.5 C)  98 F (36.7 C)  TempSrc:      Resp: 18 16 16 16   Height:      Weight:      SpO2: 95% 93% 94% 93%      Intake/Output from previous day: I/O last 3 completed shifts: In: 1200 [I.V.:1200] Out: 500 [Urine:275; Drains:200; Blood:25]   Intake/Output this shift: Total I/O In: -  Out: 1850 [Urine:1700; Drains:150]   LABORATORY DATA: No results found for this basename: WBC:2,HGB:2,HCT:2,PLT:2,NA:2,K:2,CL:2,CO2:2,BUN:2,CREATININE:2,GLUCOSE:2,GLUCAP:3,PT:2,INR:2,CALCIUM,2 in the last 72 hours  Examination: Neurologically intact ABD soft Neurovascular intact Sensation intact distally Intact pulses distally Dorsiflexion/Plantar flexion intact Incision: no drainage No cellulitis present Compartment soft} Blood and plasma separated in drain indicating minimal recent drainage, drain pulled without difficulty.  Assessment:   1 Day Post-Op Procedure(s) (LRB): TOTAL KNEE ARTHROPLASTY (Right) ADDITIONAL DIAGNOSIS:    Plan: PT/OT WBAT, CPM 5/hrs day until ROM 0-90 degrees, then D/C CPM DVT Prophylaxis:  SCDx72hrs, ASA 325 mg BID x 2 weeks DISCHARGE PLAN: Home DISCHARGE NEEDS: HHPT, HHRN, CPM, Walker and 3-in-1 comode seat     Katelyn Bell J 09/12/2011, 6:39 AM

## 2011-09-13 LAB — CBC
HCT: 33.8 % — ABNORMAL LOW (ref 36.0–46.0)
Hemoglobin: 11.9 g/dL — ABNORMAL LOW (ref 12.0–15.0)
MCH: 34 pg (ref 26.0–34.0)
MCHC: 35.2 g/dL (ref 30.0–36.0)
MCV: 96.6 fL (ref 78.0–100.0)
Platelets: 257 10*3/uL (ref 150–400)
RBC: 3.5 MIL/uL — ABNORMAL LOW (ref 3.87–5.11)
RDW: 13.6 % (ref 11.5–15.5)
WBC: 12.4 10*3/uL — ABNORMAL HIGH (ref 4.0–10.5)

## 2011-09-13 NOTE — Progress Notes (Signed)
Physical Therapy Treatment Patient Details Name: Katelyn Bell MRN: [id number] DOB: 07/25/1955 Today's Date: 09/13/2011 Time: 4098-1191 PT Time Calculation (min): 21 min  PT Assessment / Plan / Recommendation Comments on Treatment Session  Pt just returned back to room from vising husband but agreeable to ambulate & get placed in CPM.  pt motivated.      Follow Up Recommendations  Home health PT;Supervision - Intermittent    Barriers to Discharge        Equipment Recommendations  3 in 1 bedside comode    Recommendations for Other Services    Frequency 7X/week   Plan Discharge plan remains appropriate;Frequency remains appropriate    Precautions / Restrictions Precautions Precautions: Knee;Fall Precaution Booklet Issued: Yes (comment) Restrictions Weight Bearing Restrictions: No RLE Weight Bearing: Weight bearing as tolerated     Pertinent Vitals/Pain Reports pain but did not rate.     Mobility  Bed Mobility Bed Mobility: Supine to Sit;Sit to Supine Supine to Sit: 4: Min assist;HOB flat Sit to Supine: 4: Min assist;HOB flat Details for Bed Mobility Assistance: assist for RLE.   Transfers Transfers: Sit to Stand;Stand to Sit Sit to Stand: 4: Min guard;With upper extremity assist;From bed Stand to Sit: 4: Min guard;With upper extremity assist;To bed Details for Transfer Assistance: cues for safe hand placement, body positioning before sitting, & technique.  Pt with difficulty tolerating knee flexion with sit<>stand.   Ambulation/Gait Ambulation/Gait Assistance: 4: Min guard Ambulation Distance (Feet): 150 Feet Assistive device: Rolling walker Ambulation/Gait Assistance Details: Cont to encourage decreased reliance of UE's on RW, increase heel strike, increased hip/knee flexion for a smooth swing through rather than swinging leg out to side during advancement.   Gait Pattern: Step-to pattern;Antalgic;Decreased hip/knee flexion - right;Decreased stance time -  right;Decreased step length - left Gait velocity: When being timed pt ambulated 15 feet in 35 seconds.  Stairs: No Wheelchair Mobility Wheelchair Mobility: No      PT Goals Acute Rehab PT Goals PT Goal Formulation: With patient Time For Goal Achievement: 09/19/11 Potential to Achieve Goals: Good Pt will go Supine/Side to Sit: with supervision PT Goal: Supine/Side to Sit - Progress: Not met Pt will go Sit to Supine/Side: with supervision PT Goal: Sit to Supine/Side - Progress: Not met Pt will go Sit to Stand: with modified independence PT Goal: Sit to Stand - Progress: Progressing toward goal Pt will go Stand to Sit: with modified independence PT Goal: Stand to Sit - Progress: Progressing toward goal Pt will Transfer Bed to Chair/Chair to Bed: with modified independence PT Transfer Goal: Bed to Chair/Chair to Bed - Progress: Progressing toward goal Pt will Ambulate: 51 - 150 feet;with supervision;with rolling walker PT Goal: Ambulate - Progress: Progressing toward goal Pt will Go Up / Down Stairs: 3-5 stairs;with supervision;with rail(s) PT Goal: Up/Down Stairs - Progress: Not met Pt will Perform Home Exercise Program: with supervision, verbal cues required/provided PT Goal: Perform Home Exercise Program - Progress: Not met  Visit Information  Last PT Received On: 09/13/11 Assistance Needed: +1    Subjective Data      Cognition  Overall Cognitive Status: Appears within functional limits for tasks assessed/performed Arousal/Alertness: Awake/alert Orientation Level: Appears intact for tasks assessed Behavior During Session: [facility] for tasks performed    Balance  Balance Balance Assessed: No  End of Session PT - End of Session Equipment Utilized During Treatment: Gait belt Activity Tolerance: Patient tolerated treatment well Patient left: in bed;in CPM;with call bell/phone within reach Nurse Communication:  Mobility status     Verdell Face, [place] [phone] 09/13/2011

## 2011-09-13 NOTE — Progress Notes (Signed)
Occupational Therapy Treatment Patient Details Name: ANALAYA HOEY MRN: [id number] DOB: Jan 16, 1956 Today's Date: 09/13/2011 Time: 1209-1239 OT Time Calculation (min): 30 min  OT Assessment / Plan / Recommendation Comments on Treatment Session Pt is progressing steadily in ADL and mobility for ADL.  Pt with concerns about going home alone is looking at other options,    Follow Up Recommendations       Barriers to Discharge       Equipment Recommendations  3 in 1 bedside comode    Recommendations for Other Services    Frequency Min 2X/week   Plan      Precautions / Restrictions Precautions Precautions: Knee;Fall Precaution Booklet Issued: Yes (comment) Restrictions Weight Bearing Restrictions: No RLE Weight Bearing: Weight bearing as tolerated   Pertinent Vitals/Pain     ADL  Grooming: Performed;Set up;Wash/dry hands Where Assessed - Grooming: Unsupported standing Toilet Transfer: Min guard;Performed Statistician Method: Sit to Barista: Comfort height toilet;Grab bars Toileting - Architect and Hygiene: Performed;Supervision/safety Where Assessed - Engineer, mining and Hygiene: Sit on 3-in-1 or toilet Tub/Shower Transfer: Performed;Min guard Tub/Shower Transfer Method: Ambulating Equipment Used: Rolling walker;Gait belt ADL Comments: Did not address LB ADL because of arrival of visitors, will address next session.  Pt with concerns about d/c plan due to not having support at home with husband also in hospital.  Discussed options of family, sitters, rehab.  Pt states someone is coming to speak to her about her options.  Pt is concerned with not being nearby to make decisions for her husband.    OT Diagnosis:    OT Problem List:   OT Treatment Interventions:     OT Goals ADL Goals Pt Will Perform Grooming: with modified independence;Standing at sink ADL Goal: Grooming - Progress: Progressing toward goals Pt Will  Transfer to Toilet: with supervision;with DME;3-in-1 ADL Goal: Toilet Transfer - Progress: Progressing toward goals Pt Will Perform Tub/Shower Transfer: Shower transfer;with supervision;with DME;Anterior-posterior transfer;Ambulation ADL Goal: Tub/Shower Transfer - Progress: Progressing toward goals  Visit Information  Last OT Received On: 09/13/11 Assistance Needed: +1    Subjective Data      Prior Functioning       Cognition  Overall Cognitive Status: Appears within functional limits for tasks assessed/performed Arousal/Alertness: Awake/alert Orientation Level: Appears intact for tasks assessed Behavior During Session: [facility] for tasks performed    Mobility Bed Mobility Bed Mobility: Supine to Sit;Sit to Supine Supine to Sit: 4: Min assist;HOB flat Sit to Supine: 4: Min assist;HOB flat Details for Bed Mobility Assistance: Needs assistance with moving the LLE. Transfers Transfers: Sit to Stand Sit to Stand: 4: Min guard;With upper extremity assist;From bed;From toilet Stand to Sit: 4: Min guard;To bed;To toilet   Exercises    Balance    End of Session OT - End of Session Activity Tolerance: Patient tolerated treatment well Patient left: in bed;with call bell/phone within reach;with family/visitor present  GO     Evern Bio 09/13/2011, 12:46 PM [phone]

## 2011-09-13 NOTE — Progress Notes (Signed)
PATIENT ID: Katelyn Bell  MRN: [id number]  DOB/AGE:  1955/09/03 / 56 y.o.  2 Days Post-Op Procedure(s) (LRB): TOTAL KNEE ARTHROPLASTY (Right)    PROGRESS NOTE Subjective: Patient is alert, oriented, no Nausea, no Vomiting, yes passing gas, no Bowel Movement. Taking PO well. Denies SOB, Chest or Calf Pain. Using Incentive Spirometer, PAS in place. Ambulating slowly with PT. Patient reports pain as moderate  .    Objective: Vital signs in last 24 hours: Filed Vitals:   09/12/11 1400 09/12/11 1525 09/12/11 2109 09/13/11 0525  BP: 116/66 120/62 113/52 114/64  Pulse: 75 90 87 98  Temp: 97.9 F (36.6 C)  98.3 F (36.8 C) 98.4 F (36.9 C)  TempSrc:      Resp: 16  18 18   Height:      Weight:      SpO2: 95% 95% 95% 91%      Intake/Output from previous day: I/O last 3 completed shifts: In: 1860 [P.O.:360; I.V.:1500] Out: 2450 [Urine:2300; Drains:150]   Intake/Output this shift:     LABORATORY DATA:  Basename 09/13/11 0555 09/12/11 0600  WBC 12.4* 8.5  HGB 11.9* 11.6*  HCT 33.8* 34.6*  PLT 257 222  NA -- 137  K -- 3.2*  CL -- 103  CO2 -- 26  BUN -- 5*  CREATININE -- 0.59  GLUCOSE -- 129*  GLUCAP -- --  INR -- --  CALCIUM -- 8.1*    Examination: Neurologically intact ABD soft Neurovascular intact Sensation intact distally Intact pulses distally Dorsiflexion/Plantar flexion intact Incision: dressing C/D/I}  Assessment:   2 Days Post-Op Procedure(s) (LRB): TOTAL KNEE ARTHROPLASTY (Right) ADDITIONAL DIAGNOSIS:  none  Plan: PT/OT WBAT, CPM 5/hrs day until ROM 0-90 degrees, then D/C CPM DVT Prophylaxis:  SCDx72hrs, ASA 325 mg BID x 2 weeks Dressing change today. DISCHARGE PLAN: Skilled Nursing Facility/Rehab when bed available. DISCHARGE NEEDS: HHPT, HHRN, CPM, Walker and 3-in-1 comode seat     [name] M. 09/13/2011, 8:17 AM

## 2011-09-13 NOTE — Progress Notes (Signed)
Physical Therapy Treatment Patient Details Name: Katelyn Bell MRN: [id number] DOB: [birth year] Today's Date: 09/13/2011 Time: 1100-1137 PT Time Calculation (min): 37 min  PT Assessment / Plan / Recommendation Comments on Treatment Session  Pt mobility improving. Will focus on improving bed mobility this afternoon.  Informed pt that I  would like her to get in and out of bed with no more than supervision this afternoon  with pt managing her R LE modified independently    Follow Up Recommendations  Home health PT;Supervision - Intermittent    Barriers to Discharge        Equipment Recommendations  3 in 1 bedside comode    Recommendations for Other Services    Frequency 7X/week   Plan Discharge plan remains appropriate;Frequency remains appropriate    Precautions / Restrictions Precautions Precautions: Knee;Fall Precaution Booklet Issued: Yes (comment) Restrictions Weight Bearing Restrictions: No RLE Weight Bearing: Weight bearing as tolerated   Pertinent Vitals/Pain 2/10 pain in R Knee at completion of session.  No intervention needed.     Mobility  Bed Mobility Bed Mobility: Supine to Sit;Sit to Supine Supine to Sit: 4: Min assist;HOB flat Sit to Supine: 4: Min assist;HOB flat Details for Bed Mobility Assistance: Needs assistance with moving the LLE. Transfers Transfers: Sit to Stand;Stand to Sit Sit to Stand: 4: Min guard;With upper extremity assist;From bed;From toilet Stand to Sit: 4: Min guard;To bed;To toilet Details for Transfer Assistance: Cueing for technique including hand and R LE positioning  Ambulation/Gait Ambulation/Gait Assistance: 4: Min guard Ambulation Distance (Feet): 100 Feet Assistive device: Rolling walker Ambulation/Gait Assistance Details: Cued pt to increase gait speed dedcrease dependence on UEs.  Gait Pattern: Step-to pattern;Decreased stance time - right;Decreased dorsiflexion - right;Decreased hip/knee flexion - right;Decreased weight  shift to right;Trunk flexed Gait velocity: When being timed pt ambulated 15 feet in 35 seconds.  Stairs: No Wheelchair Mobility Wheelchair Mobility: No    Exercises     PT Diagnosis:    PT Problem List:   PT Treatment Interventions:     PT Goals Acute Rehab PT Goals PT Goal Formulation: With patient Time For Goal Achievement: 09/19/11 Potential to Achieve Goals: Good Pt will go Supine/Side to Sit: with supervision PT Goal: Supine/Side to Sit - Progress: Progressing toward goal Pt will go Sit to Supine/Side: with supervision PT Goal: Sit to Supine/Side - Progress: Progressing toward goal Pt will go Sit to Stand: with modified independence PT Goal: Sit to Stand - Progress: Progressing toward goal Pt will go Stand to Sit: with modified independence PT Goal: Stand to Sit - Progress: Progressing toward goal Pt will Transfer Bed to Chair/Chair to Bed: with modified independence PT Transfer Goal: Bed to Chair/Chair to Bed - Progress: Progressing toward goal Pt will Ambulate: 51 - 150 feet;with supervision;with rolling walker PT Goal: Ambulate - Progress: Progressing toward goal Pt will Go Up / Down Stairs: 3-5 stairs;with supervision;with rail(s) PT Goal: Up/Down Stairs - Progress: Not met Pt will Perform Home Exercise Program: with supervision, verbal cues required/provided PT Goal: Perform Home Exercise Program - Progress: Not met  Visit Information  Last PT Received On: 09/13/11 Assistance Needed: +1    Subjective Data      Cognition  Overall Cognitive Status: Appears within functional limits for tasks assessed/performed Arousal/Alertness: Awake/alert Orientation Level: Appears intact for tasks assessed Behavior During Session: [facility] for tasks performed    Balance  Balance Balance Assessed: No  End of Session PT - End of Session Equipment Utilized During Treatment:  Gait belt Activity Tolerance: Patient tolerated treatment well Patient left: in bed;in CPM;with call  bell/phone within reach Nurse Communication: Mobility status   GP     Tally Mckinnon 09/13/2011, 1:42 PM Shraga Custard L. Tierrah Anastos DPT [phone]

## 2011-09-14 LAB — CBC
HCT: 35.3 % — ABNORMAL LOW (ref 36.0–46.0)
Hemoglobin: 12.1 g/dL (ref 12.0–15.0)
MCH: 32.7 pg (ref 26.0–34.0)
MCHC: 34.3 g/dL (ref 30.0–36.0)
MCV: 95.4 fL (ref 78.0–100.0)
Platelets: 260 10*3/uL (ref 150–400)
RBC: 3.7 MIL/uL — ABNORMAL LOW (ref 3.87–5.11)
RDW: 13.2 % (ref 11.5–15.5)
WBC: 9.1 10*3/uL (ref 4.0–10.5)

## 2011-09-14 MED ORDER — FOLIC ACID 1 MG PO TABS
2.0000 mg | ORAL_TABLET | Freq: Every day | ORAL | Status: DC
  Administered 2011-09-14 – 2011-09-15 (×2): 2 mg via ORAL
  Filled 2011-09-14 (×2): qty 2

## 2011-09-14 NOTE — Progress Notes (Signed)
Occupational Therapy Treatment Patient Details Name: Katelyn Bell MRN: [id number] DOB: 20-Sep-1955 Today's Date: 09/14/2011 Time: 4098-1191 OT Time Calculation (min): 30 min  OT Assessment / Plan / Recommendation Comments on Treatment Session Pt continues to progress in self care with adaptive equipment and DME.  She performed at a supervision level in all mobility.    Follow Up Recommendations  Other (comment) (pt prefers rehab upon d/c as her husband is hospitalized)    Barriers to Discharge       Equipment Recommendations  3 in 1 bedside comode    Recommendations for Other Services    Frequency Min 2X/week   Plan Discharge plan remains appropriate    Precautions / Restrictions Precautions Precautions: Knee;Fall Restrictions Weight Bearing Restrictions: Yes RLE Weight Bearing: Weight bearing as tolerated   Pertinent Vitals/Pain     ADL  Grooming: Performed;Wash/dry hands;Supervision/safety Where Assessed - Grooming: Unsupported standing Lower Body Bathing: Simulated;Supervision/safety;Other (comment) (instructed in use of long sponge) Where Assessed - Lower Body Bathing: Supported sit to stand Lower Body Dressing: Performed;Minimal assistance;Other (comment) (instructed in use of reacher, sock aide, long shoe horn) Where Assessed - Lower Body Dressing: Supported sit to stand Toilet Transfer: Research scientist (life sciences) Method: Sit to Barista: Comfort height toilet;Grab bars Toileting - Architect and Hygiene: Performed;Supervision/safety Where Assessed - Engineer, mining and Hygiene: Sit on 3-in-1 or toilet Equipment Used: Rolling walker;Gait belt Transfers/Ambulation Related to ADLs: Ambulated short distances in room with supervision and RW.  Reports regularly walking to the bathroom with supervision. ADL Comments: Pt is knowledgeable in use and availability of AE for LB ADL. Pt is still looking at  rehab.    OT Diagnosis:    OT Problem List:   OT Treatment Interventions:     OT Goals ADL Goals Pt Will Perform Grooming: with modified independence;Standing at sink ADL Goal: Grooming - Progress: Progressing toward goals Pt Will Perform Lower Body Bathing: with supervision;Sit to stand from chair;Sit to stand from bed ADL Goal: Lower Body Bathing - Progress: Progressing toward goals Pt Will Perform Lower Body Dressing: with supervision;Sit to stand from bed;Sit to stand from chair ADL Goal: Lower Body Dressing - Progress: Progressing toward goals Pt Will Transfer to Toilet: with supervision;with DME;3-in-1 ADL Goal: Toilet Transfer - Progress: Met Pt Will Perform Tub/Shower Transfer: Shower transfer;with supervision;with DME;Anterior-posterior transfer;Ambulation ADL Goal: Tub/Shower Transfer - Progress: Progressing toward goals  Visit Information  Last OT Received On: 09/14/11 Assistance Needed: +1    Subjective Data      Prior Functioning       Cognition  Overall Cognitive Status: Appears within functional limits for tasks assessed/performed Arousal/Alertness: Awake/alert Orientation Level: Appears intact for tasks assessed Behavior During Session: [facility] for tasks performed    Mobility Bed Mobility Bed Mobility: Supine to Sit Supine to Sit: 6: Modified independent (Device/Increase time);Other (comment) (pt instructed in use of leg lifter) Sitting - Scoot to Edge of Bed: 7: Independent Transfers Transfers: Sit to Stand Sit to Stand: 5: Supervision;With upper extremity assist;From bed Stand to Sit: 5: Supervision;With upper extremity assist;To chair/3-in-1   Exercises    Balance    End of Session OT - End of Session Activity Tolerance: Patient tolerated treatment well Patient left: in chair;with call bell/phone within reach;with family/visitor present  GO     Evern Bio 09/14/2011, 12:12 PM [phone]

## 2011-09-14 NOTE — Progress Notes (Signed)
Physical Therapy Treatment Patient Details Name: Katelyn Bell MRN: [id number] DOB: 27-Apr-1955 Today's Date: 09/14/2011 Time: 4098-1191 PT Time Calculation (min): 10 min  PT Assessment / Plan / Recommendation Comments on Treatment Session  Educated pt on exercises for right knee strengthening and to increase ROM. Handout provided with education on exercises. Pt with trace to minimal right quad activation with exercises today.    Follow Up Recommendations  Home health PT;Supervision - Intermittent       Equipment Recommendations  Rolling walker with 5" wheels;3 in 1 bedside comode       Frequency 7X/week   Plan Discharge plan remains appropriate;Frequency remains appropriate    Precautions / Restrictions Precautions Precautions: Knee Restrictions Weight Bearing Restrictions: Yes RLE Weight Bearing: Weight bearing as tolerated          Exercises Total Joint Exercises Ankle Circles/Pumps: AROM;Both;10 reps;Supine Quad Sets: AROM;Strengthening;Right;10 reps;Supine Short Arc Quad: AAROM;Strengthening;Right;10 reps;Supine Heel Slides: AAROM;Strengthening;Right;10 reps;Supine Hip ABduction/ADduction: AAROM;Strengthening;Right;Supine Straight Leg Raises: AAROM;Strengthening;Right;10 reps;Supine      PT Goals Acute Rehab PT Goals PT Goal: Sit to Supine/Side - Progress: Progressing toward goal PT Goal: Sit to Stand - Progress: Progressing toward goal PT Goal: Stand to Sit - Progress: Progressing toward goal PT Goal: Ambulate - Progress: Progressing toward goal PT Goal: Up/Down Stairs - Progress: Met PT Goal: Perform Home Exercise Program - Progress: Progressing toward goal  Visit Information  Last PT Received On: 09/14/11 Assistance Needed: +1    Subjective Data  Subjective: No new complaints.   Cognition  Overall Cognitive Status: Appears within functional limits for tasks assessed/performed Arousal/Alertness: Awake/alert Orientation Level: Appears intact for  tasks assessed Behavior During Session: [facility] for tasks performed       End of Session PT - End of Session Equipment Utilized During Treatment: Gait belt Activity Tolerance: Patient tolerated treatment well Patient left: in bed;with call bell/phone within reach     Sallyanne Kuster 09/14/2011, 3:42 PM  Sallyanne Kuster, PTA Office- [phone]

## 2011-09-14 NOTE — Progress Notes (Signed)
Physical Therapy Treatment Patient Details Name: Katelyn Bell MRN: [id number] DOB: [birth year] Today's Date: 09/14/2011 Time: 1230-1300 PT Time Calculation (min): 30 min  PT Assessment / Plan / Recommendation Comments on Treatment Session  Great progress with stair education completed and pt placing more wt throught her right leg with gait.    Follow Up Recommendations  Home health PT;Supervision - Intermittent       Equipment Recommendations  3 in 1 bedside comode;Rolling walker with 5" wheels       Frequency 7X/week   Plan Discharge plan remains appropriate;Frequency remains appropriate    Precautions / Restrictions Precautions Precautions: Knee;Fall Restrictions Weight Bearing Restrictions: Yes RLE Weight Bearing: Weight bearing as tolerated       Mobility  Bed Mobility Bed Mobility: Sit to Supine;Scooting to HOB Supine to Sit: 6: Modified independent (Device/Increase time);Other (comment) (pt instructed in use of leg lifter) Sitting - Scoot to Edge of Bed: 7: Independent Sit to Supine: 4: Min guard;4: Min assist;HOB flat Scooting to [facility]: 5: Supervision Details for Bed Mobility Assistance: min assist to clear bed surface with right leg while lying down (pt was educated on how to use her left leg to help raise up her right leg). Pt able to scoot self up in bed without assistance. Transfers Transfers: Sit to Stand;Stand to Sit Sit to Stand: 5: Supervision;From chair/3-in-1;With upper extremity assist Stand to Sit: 5: Supervision;To bed;With upper extremity assist Details for Transfer Assistance: no cues or assistance needed Ambulation/Gait Ambulation/Gait Assistance: 5: Supervision Ambulation Distance (Feet): 110 Feet Assistive device: Rolling walker Ambulation/Gait Assistance Details: min cues to decrease UE use with gait, walker adjusted down to better fit pt and pt was able to place more wt on her right leg with stance. progressed to step through gait pattern with  cues and distance walked. Gait Pattern: Step-to pattern;Step-through pattern;Decreased stance time - right;Decreased step length - left;Antalgic Stairs: Yes Stairs Assistance: 4: Min guard Stair Management Technique: Forwards;Two rails;Step to pattern Number of Stairs: 3     Exercises Total Joint Exercises Ankle Circles/Pumps: AROM;Both;10 reps;Supine Quad Sets: AROM;Strengthening;Both;10 reps;Supine Heel Slides: AAROM;Strengthening;Right;10 reps;Supine Straight Leg Raises: AAROM;Strengthening;Right;10 reps;Supine Goniometric ROM: supine in bed: active knee flextion 60 degrees, active-assist knee flexion 70 degrees    PT Goals Acute Rehab PT Goals PT Goal: Sit to Supine/Side - Progress: Progressing toward goal PT Goal: Sit to Stand - Progress: Progressing toward goal PT Goal: Stand to Sit - Progress: Progressing toward goal PT Goal: Ambulate - Progress: Progressing toward goal PT Goal: Up/Down Stairs - Progress: Met PT Goal: Perform Home Exercise Program - Progress: Progressing toward goal  Visit Information  Last PT Received On: 09/14/11 Assistance Needed: +1    Subjective Data  Subjective: No new complaints, Agreeable to therapy today.   Cognition  Overall Cognitive Status: Appears within functional limits for tasks assessed/performed Arousal/Alertness: Awake/alert Orientation Level: Appears intact for tasks assessed Behavior During Session: [facility] for tasks performed       End of Session PT - End of Session Equipment Utilized During Treatment: Gait belt Activity Tolerance: Patient tolerated treatment well Patient left: in bed;with call bell/phone within reach Nurse Communication: Mobility status CPM Right Knee CPM Right Knee: On Right Knee Flexion (Degrees): 65  Right Knee Extension (Degrees): -1      Sallyanne Kuster 09/14/2011, 1:39 PM  Sallyanne Kuster, PTA Office- [phone]

## 2011-09-14 NOTE — Progress Notes (Signed)
PATIENT ID: Katelyn Bell  MRN: [id number]  DOB/AGE:  57-17-1957 / 57 y.o.  3 Days Post-Op Procedure(s) (LRB): TOTAL KNEE ARTHROPLASTY (Right)    PROGRESS NOTE Subjective: Patient is alert, oriented, no Nausea, no Vomiting, yes passing gas, yes Bowel Movement (complains of diarrhea due to not taking folic acid inpatient). Taking PO well. Denies SOB, Chest or Calf Pain. Using Incentive Spirometer, PAS in place. Ambulating slowly with PT. Patient reports pain as moderate  .    Objective: Vital signs in last 24 hours: Filed Vitals:   09/13/11 0525 09/13/11 1400 09/13/11 2244 09/14/11 0538  BP: 114/64 111/71 125/66 136/82  Pulse: 98 82 82 94  Temp: 98.4 F (36.9 C) 98.3 F (36.8 C) 98.2 F (36.8 C) 97.8 F (36.6 C)  TempSrc:      Resp: 18 16 18 18   Height:      Weight:      SpO2: 91% 97% 96% 98%      Intake/Output from previous day:     Intake/Output this shift:     LABORATORY DATA:  Basename 09/14/11 0420 09/13/11 0555 09/12/11 0600  WBC 9.1 12.4* --  HGB 12.1 11.9* --  HCT 35.3* 33.8* --  PLT 260 257 --  NA -- -- 137  K -- -- 3.2*  CL -- -- 103  CO2 -- -- 26  BUN -- -- 5*  CREATININE -- -- 0.59  GLUCOSE -- -- 129*  GLUCAP -- -- --  INR -- -- --  CALCIUM -- -- 8.1*    Examination: Neurologically intact ABD soft Neurovascular intact Sensation intact distally Intact pulses distally Dorsiflexion/Plantar flexion intact Incision: dressing C/D/I}  Assessment:   3 Days Post-Op Procedure(s) (LRB): TOTAL KNEE ARTHROPLASTY (Right) ADDITIONAL DIAGNOSIS:  none  Plan: PT/OT WBAT, CPM 5/hrs day until ROM 0-90 degrees, then D/C CPM DVT Prophylaxis:  SCDx72hrs, ASA 325 mg BID x 2 weeks DISCHARGE PLAN: Skilled Nursing Facility/Rehab when bed available. DISCHARGE NEEDS: HHPT, HHRN, CPM, Walker and 3-in-1 comode seat     Katelyn Bell M. 09/14/2011, 8:08 AM

## 2011-09-15 MED ORDER — OXYCODONE-ACETAMINOPHEN 5-325 MG PO TABS: 1.0000 | ORAL_TABLET | ORAL | Status: AC | PRN

## 2011-09-15 MED ORDER — ASPIRIN 325 MG PO TBEC: 325.0000 mg | DELAYED_RELEASE_TABLET | Freq: Two times a day (BID) | ORAL | Status: AC

## 2011-09-15 MED ORDER — METHOCARBAMOL 500 MG PO TABS: 500.0000 mg | ORAL_TABLET | Freq: Four times a day (QID) | ORAL | Status: AC | PRN

## 2011-09-15 NOTE — Progress Notes (Signed)
Physical Therapy Treatment Patient Details Name: Katelyn Bell MRN: [id number] DOB: 1956/01/25 Today's Date: 09/15/2011 Time: 4098-1191 PT Time Calculation (min): 25 min  PT Assessment / Plan / Recommendation Comments on Treatment Session  Discussed therex with patient who stated that she has been performing exercises since given the sheet yesterday. Pt with good ROM during all functional activity, 0-65. Pt unable to tolerate 55 degrees on CPM, therefore ended CPM on 50 degrees. Plan to d/c today.    Follow Up Recommendations  Home health PT;Supervision - Intermittent    Barriers to Discharge        Equipment Recommendations  Rolling walker with 5" wheels;3 in 1 bedside comode    Recommendations for Other Services    Frequency 7X/week   Plan Discharge plan remains appropriate;Frequency remains appropriate    Precautions / Restrictions Precautions Precautions: Knee Restrictions Weight Bearing Restrictions: Yes RLE Weight Bearing: Weight bearing as tolerated   Pertinent Vitals/Pain Pain 4/10 this session    Mobility  Bed Mobility Bed Mobility: Supine to Sit;Sitting - Scoot to [place] of Bed;Sit to Supine Supine to Sit: 4: Min assist Sitting - Scoot to Edge of Bed: 6: Modified independent (Device/Increase time) Sit to Supine: 4: Min assist Details for Bed Mobility Assistance: Min assist with RLE, although pt able to support majority of weight of leg. Pt able to scoot self to [facility] with assist of holding RLE.  Transfers Transfers: Sit to Stand;Stand to Sit Sit to Stand: 5: Supervision;With upper extremity assist;From bed;From toilet Stand to Sit: 5: Supervision;With upper extremity assist;To chair/3-in-1;To toilet Details for Transfer Assistance: VC for hand placement.  Ambulation/Gait Ambulation/Gait Assistance: 5: Supervision Ambulation Distance (Feet): 300 Feet Assistive device: Rolling walker Ambulation/Gait Assistance Details: VC throughout ambulation for proper  sequencing and increased knee flexion during swing through. Pt safely ambulating with RW as well as a safe distance.  Gait Pattern: Step-to pattern;Step-through pattern;Decreased stance time - right;Decreased step length - left;Antalgic Stairs: Yes Stairs Assistance: 4: Min assist Stairs Assistance Details (indicate cue type and reason): VC for proper sequencing with RLE.  HHA on left as there was no railing on big stairs.  Stair Management Technique: Forwards;Step to pattern;One rail Right;Other (comment) (HHA left) Number of Stairs: 4     Exercises     PT Diagnosis:    PT Problem List:   PT Treatment Interventions:     PT Goals Acute Rehab PT Goals PT Goal Formulation: With patient PT Goal: Supine/Side to Sit - Progress: Progressing toward goal PT Goal: Sit to Supine/Side - Progress: Progressing toward goal PT Goal: Sit to Stand - Progress: Progressing toward goal PT Goal: Stand to Sit - Progress: Progressing toward goal PT Transfer Goal: Bed to Chair/Chair to Bed - Progress: Progressing toward goal PT Goal: Ambulate - Progress: Progressing toward goal PT Goal: Up/Down Stairs - Progress: Progressing toward goal PT Goal: Perform Home Exercise Program - Progress: Progressing toward goal  Visit Information  Last PT Received On: 09/15/11 Assistance Needed: +1    Subjective Data      Cognition  Overall Cognitive Status: Appears within functional limits for tasks assessed/performed Arousal/Alertness: Awake/alert Orientation Level: Appears intact for tasks assessed Behavior During Session: [facility] for tasks performed    Balance     End of Session PT - End of Session Equipment Utilized During Treatment: Gait belt Activity Tolerance: Patient tolerated treatment well Patient left: in bed;with call bell/phone within reach Nurse Communication: Mobility status   GP     Trinidi Toppins,  Richelle Ito 09/15/2011, 9:50 AM

## 2011-09-15 NOTE — Discharge Summary (Signed)
Patient ID: Katelyn Bell MRN: [id number] DOB/AGE: 1955/05/14 56 y.o.  Admit date: 09/11/2011 Discharge date: 09/15/2011  Admission Diagnoses:  Active Problems:  Osteoarthritis of right knee   Discharge Diagnoses:  Same  Past Medical History  Diagnosis Date  . Rheumatoid arthritis   . Fluid retention   . GERD (gastroesophageal reflux disease)   . Osteoarthritis     Surgeries: Procedure(s): TOTAL KNEE ARTHROPLASTY on 09/11/2011   Consultants:    Discharged Condition: Improved  Hospital Course: Katelyn Bell is an 56 y.o. female who was admitted 09/11/2011 for operative treatment of<principal problem not specified>. Patient has severe unremitting pain that affects sleep, daily activities, and work/hobbies. After pre-op clearance the patient was taken to the operating room on 09/11/2011 and underwent  Procedure(s): TOTAL KNEE ARTHROPLASTY.    Patient was given perioperative antibiotics: Anti-infectives     Start     Dose/Rate Route Frequency Ordered Stop   09/11/11 1447   cefUROXime (ZINACEF) injection  Status:  Discontinued          As needed 09/11/11 1448 09/11/11 1526   09/10/11 1425   vancomycin (VANCOCIN) IVPB 1000 mg/200 mL premix        1,000 mg 200 mL/hr over 60 Minutes Intravenous 60 min pre-op 09/10/11 1425 09/11/11 1345           Patient was given sequential compression devices, early ambulation, and chemoprophylaxis to prevent DVT.  Patient benefited maximally from hospital stay and there were no complications.    Recent vital signs: Patient Vitals for the past 24 hrs:  BP Temp Temp src Pulse Resp SpO2  09/15/11 0800 - - - - 16  98 %  09/15/11 0638 126/79 mmHg 97.3 F (36.3 C) - 86  18  98 %  09/15/11 0400 - - - - 18  98 %  09/15/11 0000 - - - - 18  98 %  09/14/11 2323 109/62 mmHg 97.9 F (36.6 C) - 87  18  98 %  09/14/11 2000 - - - - 18  98 %  09/14/11 1922 127/76 mmHg 99 F (37.2 C) Oral - 18  96 %     Recent laboratory studies:    Basename 09/14/11 0420 09/13/11 0555  WBC 9.1 12.4*  HGB 12.1 11.9*  HCT 35.3* 33.8*  PLT 260 257  NA -- --  K -- --  CL -- --  CO2 -- --  BUN -- --  CREATININE -- --  GLUCOSE -- --  INR -- --  CALCIUM -- --     Discharge Medications:   Medication List  As of 09/15/2011 10:02 AM   TAKE these medications         aspirin 325 MG EC tablet   Take 1 tablet (325 mg total) by mouth 2 (two) times daily.      azelastine 137 MCG/SPRAY nasal spray   Commonly known as: ASTELIN   Place 2 sprays into the nose daily. Use in each nostril as directed      b complex vitamins capsule   Take 1 capsule by mouth daily.      calcium carbonate 1250 MG chewable tablet   Commonly known as: OS-CAL   Chew 1 tablet by mouth daily.      cetirizine-pseudoephedrine 5-120 MG per tablet   Commonly known as: ZYRTEC-D   Take 1 tablet by mouth daily.      Cinnamon 500 MG capsule   Take 2,000 mg by mouth daily.  estrogens (conjugated) 0.9 MG tablet   Commonly known as: PREMARIN   Take 0.9 mg by mouth daily. Take daily for 21 days then do not take for 7 days.      fish oil-omega-3 fatty acids 1000 MG capsule   Take 1 g by mouth daily.      folic acid 1 MG tablet   Commonly known as: FOLVITE   Take 2 mg by mouth daily.      hydrochlorothiazide 25 MG tablet   Commonly known as: HYDRODIURIL   Take 25 mg by mouth daily.      Magnesium 250 MG Tabs   Take 500 mg by mouth daily.      methocarbamol 500 MG tablet   Commonly known as: ROBAXIN   Take 1 tablet (500 mg total) by mouth every 6 (six) hours as needed.      methotrexate 2.5 MG tablet   Commonly known as: RHEUMATREX   Take 25 mg by mouth once a week. Caution:Chemotherapy. Protect from light.      omeprazole 20 MG capsule   Commonly known as: PRILOSEC   Take 20 mg by mouth daily.      oxyCODONE-acetaminophen 5-325 MG per tablet   Commonly known as: PERCOCET/ROXICET   Take 1-2 tablets by mouth every 4 (four) hours as needed for  pain.      vitamin C 500 MG tablet   Commonly known as: ASCORBIC ACID   Take 1,000 mg by mouth daily.      vitamin E 200 UNIT capsule   Take 200 Units by mouth daily.            Diagnostic Studies: Dg Chest 2 View  09/04/2011  *RADIOLOGY REPORT*  Clinical Data: Preop right total knee arthroplasty  CHEST - 2 VIEW  Comparison: 02/04/2007  Findings: Lungs are essentially clear. No pleural effusion or pneumothorax.  Cardiomediastinal silhouette is within normal limits.  Mild degenerative changes of the visualized thoracolumbar spine.  IMPRESSION: No evidence of acute cardiopulmonary disease.  Original Report Authenticated By: Charline Bills, M.D.    Disposition: Final discharge disposition not confirmed  Discharge Orders    Future Orders Please Complete By Expires   CPM      Comments:   For home use   Increase activity slowly      Walker       May shower / Bathe      Driving Restrictions      Comments:   No driving for 2 weeks.   Change dressing (specify)      Comments:   Dressing change as needed.   Call MD for:  temperature >100.4      Call MD for:  severe uncontrolled pain      Call MD for:  redness, tenderness, or signs of infection (pain, swelling, redness, odor or green/yellow discharge around incision site)      Discharge instructions      Comments:   F/U with Dr. Turner Daniels in 10 days [phone]         Signed: Hazle Nordmann. 09/15/2011, 10:02 AM

## 2011-09-15 NOTE — Progress Notes (Signed)
Physical Therapy Treatment Patient Details Name: Katelyn Bell MRN: [id number] DOB: Jun 26, 1955 Today's Date: 09/15/2011 Time: 4098-1191 PT Time Calculation (min): 38 min  PT Assessment / Plan / Recommendation Comments on Treatment Session  Great progression this second session with pt functioning at a mod I level with all mobiltiy. Still with a weak quad activation today with exercises, no buckling with gait due to pt with decreased wt on right LE with stance and heavy reliance on walker to substitute  for right LE pain/weakness. Educated pt to continue to focus on SLR's, SAG;s and knee presses for increased quad activation.                          Follow Up Recommendations  Home health PT       Equipment Recommendations  Rolling walker with 5" wheels;3 in 1 bedside comode       Frequency 7X/week   Plan Discharge plan remains appropriate;Frequency remains appropriate    Precautions / Restrictions Precautions Precautions: Knee Restrictions Weight Bearing Restrictions: Yes RLE Weight Bearing: Weight bearing as tolerated       Mobility  Bed Mobility Bed Mobility: Supine to Sit;Sit to Supine Supine to Sit: 6: Modified independent (Device/Increase time);HOB flat Sitting - Scoot to Edge of Bed: 6: Modified independent (Device/Increase time) Sit to Supine: 6: Modified independent (Device/Increase time);HOB flat Scooting to HOB: 6: Modified independent (Device/Increase time) Details for Bed Mobility Assistance: Pt used belt to move right leg off and back onto bed. able to use arms to scoot self to edge of bed and up in bed when preparing to lie back down. no cues or assistance needed. Does need increased time with all bed mobiltiy. Transfers Transfers: Sit to Stand;Stand to Sit Sit to Stand: 6: Modified independent (Device/Increase time);With upper extremity assist;From bed Stand to Sit: 6: Modified independent (Device/Increase time);With upper extremity assist;To bed Details for  Transfer Assistance: no cues or assistance needed. Ambulation/Gait Ambulation/Gait Assistance: 6: Modified independent (Device/Increase time) Ambulation Distance (Feet): 300 Feet Assistive device: Rolling walker Ambulation/Gait Assistance Details: VC throughout ambulation for proper sequencing and increased knee flexion during swing through. Pt safely ambulating with RW as well as a safe distance.  Gait Pattern: Step-through pattern;Decreased stance time - right;Decreased step length - left;Antalgic Stairs: Yes Stairs Assistance: 5: Supervision Stairs Assistance Details (indicate cue type and reason): pt able to independetly demo correct technique/sequence with stairs without cues/assistance Stair Management Technique: Two rails;Step to pattern;Forwards Number of Stairs: 5     Exercises Total Joint Exercises Ankle Circles/Pumps: AROM;Both;10 reps;Supine Quad Sets: AROM;Strengthening;Right;10 reps;Supine Short Arc Quad: AAROM;Strengthening;Right;10 reps;Supine Heel Slides: AAROM;Strengthening;Right;10 reps;Supine Hip ABduction/ADduction: AAROM;Strengthening;Right;10 reps;Supine Straight Leg Raises: AAROM;Strengthening;Right;Supine;10 reps    PT Goals Acute Rehab PT Goals PT Goal Formulation: With patient PT Goal: Supine/Side to Sit - Progress: Met PT Goal: Sit to Supine/Side - Progress: Met PT Goal: Sit to Stand - Progress: Met PT Goal: Stand to Sit - Progress: Met PT Transfer Goal: Bed to Chair/Chair to Bed - Progress: Met PT Goal: Ambulate - Progress: Met PT Goal: Up/Down Stairs - Progress: Met PT Goal: Perform Home Exercise Program - Progress: Progressing toward goal  Visit Information  Last PT Received On: 09/15/11 Assistance Needed: +1    Subjective Data  Subjective: No new complaints, ready to go home today.   Cognition  Overall Cognitive Status: Appears within functional limits for tasks assessed/performed Arousal/Alertness: Awake/alert Orientation Level: Appears  intact for tasks assessed Behavior During  Session: [facility] for tasks performed       End of Session PT - End of Session Equipment Utilized During Treatment: Gait belt Activity Tolerance: Patient tolerated treatment well Patient left: in bed;with call bell/phone within reach;with family/visitor present Nurse Communication: Mobility status    Sallyanne Kuster 09/15/2011, 11:53 AM  Sallyanne Kuster, PTA Office- [phone]

## 2011-09-15 NOTE — Progress Notes (Signed)
PATIENT ID: AARIAN CLEAVER  MRN: [id number]  DOB/AGE:  57-Apr-1957 / 57 y.o.  4 Days Post-Op Procedure(s) (LRB): TOTAL KNEE ARTHROPLASTY (Right)    PROGRESS NOTE Subjective: Patient is alert, oriented, no Nausea, no Vomiting, yes passing gas, no Bowel Movement. Taking PO well. Denies SOB, Chest or Calf Pain. Using Incentive Spirometer, PAS in place. Ambulating well with PT, CPM at 60 degrees. Patient reports pain as moderate  .    Objective: Vital signs in last 24 hours: Filed Vitals:   09/15/11 0000 09/15/11 0400 09/15/11 0638 09/15/11 0800  BP:   126/79   Pulse:   86   Temp:   97.3 F (36.3 C)   TempSrc:      Resp: 18 18 18 16   Height:      Weight:      SpO2: 98% 98% 98% 98%      Intake/Output from previous day: I/O last 3 completed shifts: In: 1520 [P.O.:1520] Out: -    Intake/Output this shift:     LABORATORY DATA:  Basename 09/14/11 0420 09/13/11 0555  WBC 9.1 12.4*  HGB 12.1 11.9*  HCT 35.3* 33.8*  PLT 260 257  NA -- --  K -- --  CL -- --  CO2 -- --  BUN -- --  CREATININE -- --  GLUCOSE -- --  GLUCAP -- --  INR -- --  CALCIUM -- --    Examination: Neurologically intact ABD soft Neurovascular intact Sensation intact distally Intact pulses distally Dorsiflexion/Plantar flexion intact Incision: dressing C/D/I}  Assessment:   4 Days Post-Op Procedure(s) (LRB): TOTAL KNEE ARTHROPLASTY (Right) ADDITIONAL DIAGNOSIS:  none  Plan: PT/OT WBAT, CPM 5/hrs day until ROM 0-90 degrees, then D/C CPM DVT Prophylaxis:  SCDx72hrs, ASA 325 mg BID x 2 weeks DISCHARGE PLAN: Home today DISCHARGE NEEDS: HHPT, HHRN, CPM, Walker and 3-in-1 comode seat     Yanique Mulvihill M. 09/15/2011, 9:57 AM

## 2011-09-16 NOTE — Progress Notes (Signed)
Patient lived at home with her husband who has recently suffered a stroke and is now in the ICU here at [facility]. Patient has a large supportive family who will be helping her at home.  SNF placement is not desired.  Discussed with RNCM; reivew of PT records indicated recommendation of home with home health.  Family present and supportive. No further CSW needs at this time.  CSW signing off.  Lorri Frederick. [place]  [phone]

## 2011-09-16 NOTE — Care Management Note (Signed)
    Page 1 of 2   09/16/2011     10:14:20 AM   CARE MANAGEMENT NOTE 09/16/2011  Patient:  Katelyn Bell, Katelyn Bell   Account Number:  [id number]  Date Initiated:  09/12/2011  Documentation initiated by:  Vance Peper  Subjective/Objective Assessment:   56 yr old female s/p right total knee arthroplasty     Action/Plan:   CM spke with patient regarding home health needs at discharge. Unfortunately her husband suffered a stroke this am and she is dealing with that at present. Family will assist her at discharge. Preoperatively setup with [facility]. No change.   Anticipated DC Date:  09/13/2011   Anticipated DC Plan:  HOME W HOME HEALTH SERVICES      DC Planning Services  CM consult      [facility] Choice  HOME HEALTH   Choice offered to / List presented to:  [name]   DME arranged  CPM      DME agency  [employer]     HH arranged  HH-2 PT      [facility] agency  [facility]   Status of service:  Completed, signed off Medicare Important Message given?   (If response is "NO", the following Medicare IM given date fields will be blank) Date Medicare IM given:   Date Additional Medicare IM given:    Discharge Disposition:  HOME W HOME HEALTH SERVICES  Per UR Regulation:  Reviewed for med. necessity/level of care/duration of stay  If discussed at Long Length of Stay Meetings, dates discussed:    Comments:  09/16/11 10:08 Katelyn Silveria RN, BSN [phone] received call from patient stating she does not have her CPM machine, I called Rhonda at [employer] and she states they are enroute to deliver it to patient now.  Also spoke with [facility] in [place] and their physical therapist is on their way to see patient as well.  Pateint was dc on 8/18.

## 2013-12-09 ENCOUNTER — Encounter: Payer: Self-pay | Admitting: Podiatry

## 2013-12-09 ENCOUNTER — Ambulatory Visit (INDEPENDENT_AMBULATORY_CARE_PROVIDER_SITE_OTHER): Payer: 59 | Admitting: Podiatry

## 2013-12-09 VITALS — BP 140/77 | HR 71 | Resp 16 | Ht 60.0 in | Wt 128.0 lb

## 2013-12-09 DIAGNOSIS — L600 Ingrowing nail: Secondary | ICD-10-CM

## 2013-12-09 MED ORDER — NEOMYCIN-POLYMYXIN-HC 3.5-10000-1 OT SOLN
OTIC | Status: DC
Start: 2013-12-09 — End: 2015-03-07

## 2013-12-09 NOTE — Progress Notes (Signed)
   Subjective:    Patient ID: Katelyn Bell, female    DOB: 09/28/1955, 58 y.o.   MRN: [id number]  HPI Comments: Right great toenail ingrown lateral corner. Keep digging at it      Review of Systems  All other systems reviewed and are negative.      Objective:   Physical Exam: I have reviewed her past medical history medications allergy surgery social history and review systems. Pulses are strongly palpable bilateral. Neurologic sensorium is intact for Semmes-Weinstein monofilament. He can reflexes are intact bilateral. Muscle strength is 5 over 5 dorsiflexors plantar flexors and inverters everters all intrinsic musculature is intact. Orthopedic evaluation demonstrates all joints distal to the ankle before range of motion without crepitation. Cutaneous evaluation demonstrates supple well-hydrated cutis with exception or sharper graded nail margin along the tibial and fibular border of the hallux left.        Assessment & Plan:  Assessment: Ingrown nail paronychia Says hallux left.  Plan: Discussed etiology pathology conservative versus surgical therapies matrixectomy was performed today permanent in nature after local anesthetic was achieved. She tolerated the procedure well and was given both oral and written home-going instructions for the care of her toe and soaking therapy. Wrote a prescription for Cortisporin which will be applied twice daily area follow up with her in 1 week.

## 2013-12-09 NOTE — Patient Instructions (Signed)

## 2013-12-16 ENCOUNTER — Encounter: Payer: Self-pay | Admitting: Podiatry

## 2013-12-16 ENCOUNTER — Ambulatory Visit (INDEPENDENT_AMBULATORY_CARE_PROVIDER_SITE_OTHER): Payer: 59 | Admitting: Podiatry

## 2013-12-16 DIAGNOSIS — L600 Ingrowing nail: Secondary | ICD-10-CM

## 2013-12-17 NOTE — Progress Notes (Signed)
She presents today for follow-up of a matrixectomy hallux right. She denies fever chills nausea vomiting muscle aches and pains. She states that she continues to soak twice daily and Betadine and water and applies Corticosporin otic regularly.  Objective: Vital signs are stable she is alert and oriented 3. No erythema or edema. Margins appear to be healing well.  Assessment: Well-healing surgical toe hallux right.  Plan: Discontinue Betadine start with Epsom salts and warm water soaks covered in the day and leave open at night. Continue soaks until completely healed. Should she redevelop pain and drainage she is to notify us immediately.

## 2015-03-07 ENCOUNTER — Ambulatory Visit (INDEPENDENT_AMBULATORY_CARE_PROVIDER_SITE_OTHER): Payer: 59 | Admitting: Podiatry

## 2015-03-07 ENCOUNTER — Encounter: Payer: Self-pay | Admitting: Podiatry

## 2015-03-07 DIAGNOSIS — L600 Ingrowing nail: Secondary | ICD-10-CM | POA: Diagnosis not present

## 2015-03-07 MED ORDER — NEOMYCIN-POLYMYXIN-HC 1 % OT SOLN: OTIC | Status: DC

## 2015-03-07 NOTE — Progress Notes (Signed)
She presents today with chief complaint of a painful ingrown toenail tibial border of the hallux left 2 weeks.   Objective: vital signs are stable she is alert and oriented 3. Pulses are strongly palpable. Neurologic sensorium is intact. Sharp radial nail margin along the tibial border of the hallux left does demonstrate mild erythema no purulence no malodor.  Assessment: Ingrown toenail paronychia abscess hallux  Left.  Plan: Chemical matrixectomy was performed today after local anesthesia was administered and she tolerated this procedure well without complications. She received both oral and written home-going instructions for care and soaking of her toe as well as a prescription for Cortisporin Otic to be applied twice daily after soaking. I will follow-up with her in 1 week.

## 2015-03-07 NOTE — Patient Instructions (Addendum)

## 2015-03-16 ENCOUNTER — Ambulatory Visit (INDEPENDENT_AMBULATORY_CARE_PROVIDER_SITE_OTHER): Payer: 59 | Admitting: Podiatry

## 2015-03-16 ENCOUNTER — Encounter: Payer: Self-pay | Admitting: Podiatry

## 2015-03-16 DIAGNOSIS — L600 Ingrowing nail: Secondary | ICD-10-CM

## 2015-03-16 NOTE — Patient Instructions (Signed)

## 2015-03-18 NOTE — Progress Notes (Signed)
She presents today for follow-up of her matrixectomy hallux left. She states this seems to be doing just fine. She continues to soak twice daily.  Objective: Vital signs are stable she is alert and oriented 3. Pulses are palpable. No erythema edema saline as drainage or odor hallux left. No signs of infection.  Assessment: Well-healing surgical foot left.  Plan: I would recommend she continue set into completely resolved. Follow-up with me with questions or concerns or worsening of condition.

## 2015-04-04 ENCOUNTER — Ambulatory Visit (INDEPENDENT_AMBULATORY_CARE_PROVIDER_SITE_OTHER): Payer: 59 | Admitting: Podiatry

## 2015-04-04 ENCOUNTER — Ambulatory Visit (INDEPENDENT_AMBULATORY_CARE_PROVIDER_SITE_OTHER): Payer: 59

## 2015-04-04 ENCOUNTER — Encounter: Payer: Self-pay | Admitting: Podiatry

## 2015-04-04 VITALS — BP 125/80 | HR 71 | Resp 16

## 2015-04-04 DIAGNOSIS — G5761 Lesion of plantar nerve, right lower limb: Secondary | ICD-10-CM

## 2015-04-04 DIAGNOSIS — M79674 Pain in right toe(s): Secondary | ICD-10-CM | POA: Diagnosis not present

## 2015-04-04 DIAGNOSIS — G5781 Other specified mononeuropathies of right lower limb: Secondary | ICD-10-CM

## 2015-04-04 DIAGNOSIS — L03032 Cellulitis of left toe: Secondary | ICD-10-CM | POA: Diagnosis not present

## 2015-04-04 MED ORDER — DOXYCYCLINE HYCLATE 100 MG PO TABS: 100.0000 mg | ORAL_TABLET | Freq: Two times a day (BID) | ORAL | Status: DC

## 2015-04-04 NOTE — Patient Instructions (Signed)

## 2015-04-05 NOTE — Progress Notes (Signed)
She presents today with a chief complaint of a painful area between her third and fourth digits of her right foot. This is causing her fourth toe to hurt. She denies trauma. She also is concerned about a nonhealing area tibial border of the hallux left nail plate where matrixectomy was performed. She states that she continue to soak until it had completely resolved but then it opened up again later and started draining.  Objective: Vital signs are stable she is alert and oriented 3 pulses are palpable bilateral. She has a palpable Mulder's click to the third interdigital space of the right foot. Left foot demonstrates mild dehiscence with mild erythema around the proximal border of the nail hallux left. This is consistent with an abscess or paronychia.  Assessment: Paronychia proximal nail fold status post matrixectomy tibial border hallux left. Neuroma third interdigital space right foot.  Plan: Injected the third intradural space today with Kenalog and local anesthetic started her on doxycycline for the left foot and encouraged her soak in Epsom salts and warm water left foot. She should cover this during the daytime and leave it open at bedtime. Follow-up with her in 2 weeks

## 2015-04-18 ENCOUNTER — Ambulatory Visit (INDEPENDENT_AMBULATORY_CARE_PROVIDER_SITE_OTHER): Payer: 59 | Admitting: Podiatry

## 2015-04-18 ENCOUNTER — Encounter: Payer: Self-pay | Admitting: Podiatry

## 2015-04-18 VITALS — BP 124/80 | HR 58 | Resp 12

## 2015-04-18 DIAGNOSIS — L03032 Cellulitis of left toe: Secondary | ICD-10-CM

## 2015-04-18 MED ORDER — MUPIROCIN 2 % EX OINT: TOPICAL_OINTMENT | CUTANEOUS | Status: AC

## 2015-04-18 NOTE — Progress Notes (Signed)
She presents today for follow-up of matrixectomy left hallux. She states it seems to be doing a little better but I think it still may be infected.  Objective: Vital signs are stable alert and oriented 3. Mild erythema or cellulitis drainage or odor.  Assessment: Slowly healing matrixectomy  Plan: Follow up with her in a few weeks if this is not completely better radiographs may be necessary. I also encouraged her to continue to soak Epsom salts and warm water daily and we will apply Bactroban ointment which was a prescription we sent over electronically.

## 2015-05-02 ENCOUNTER — Ambulatory Visit: Payer: 59 | Admitting: Podiatry

## 2015-07-06 ENCOUNTER — Ambulatory Visit (INDEPENDENT_AMBULATORY_CARE_PROVIDER_SITE_OTHER): Payer: 59 | Admitting: Podiatry

## 2015-07-06 ENCOUNTER — Encounter: Payer: Self-pay | Admitting: Podiatry

## 2015-07-06 VITALS — BP 121/74 | HR 64 | Resp 12

## 2015-07-06 DIAGNOSIS — D3610 Benign neoplasm of peripheral nerves and autonomic nervous system, unspecified: Secondary | ICD-10-CM | POA: Diagnosis not present

## 2015-07-06 NOTE — Progress Notes (Signed)
She states that her right foot fourth toe pain has reoccurred though was diminished after injecting the third interdigital space with Kenalog last visit.  Objective: Vital signs are stable she is alert and oriented 3 pulses are strongly palpable. She has a palpable Mulder's click third interdigital space with radiating pain to the distal aspect of the toe. No open lesions or wounds.  Assessment: Neuroma with decrease in symptoms from previous injection third interdigital space right foot.  Plan: I initiated dehydrated alcohol injections today to the third interspace of the right foot. Follow up with her in 3 weeks for her second dose.

## 2015-07-27 ENCOUNTER — Encounter: Payer: Self-pay | Admitting: Podiatry

## 2015-07-27 ENCOUNTER — Ambulatory Visit (INDEPENDENT_AMBULATORY_CARE_PROVIDER_SITE_OTHER): Payer: 59 | Admitting: Podiatry

## 2015-07-27 DIAGNOSIS — G5781 Other specified mononeuropathies of right lower limb: Secondary | ICD-10-CM

## 2015-07-27 DIAGNOSIS — G5761 Lesion of plantar nerve, right lower limb: Secondary | ICD-10-CM

## 2015-07-27 NOTE — Progress Notes (Signed)
She presents today for follow-up of neuroma third interspace of the right foot after initiating dehydrated alcohol last visit. She states there is some better.  Objective: Vital signs are stable alert and oriented 3. Pulses are palpable. She has pain on palpation third interspace of the right foot.  Assessment: Neuroma third interspace right foot.  Plan: Initiated her second dose of dehydrated alcohol today will follow up with her in 3-4 weeks.

## 2015-08-17 ENCOUNTER — Encounter: Payer: Self-pay | Admitting: Podiatry

## 2015-08-17 ENCOUNTER — Ambulatory Visit (INDEPENDENT_AMBULATORY_CARE_PROVIDER_SITE_OTHER): Payer: 59 | Admitting: Podiatry

## 2015-08-17 DIAGNOSIS — G5761 Lesion of plantar nerve, right lower limb: Secondary | ICD-10-CM

## 2015-08-17 DIAGNOSIS — G5781 Other specified mononeuropathies of right lower limb: Secondary | ICD-10-CM

## 2015-08-19 NOTE — Progress Notes (Signed)
She presents today for follow-up of her painful right foot. She states that it is approximately 50% better as she refers to the neuroma third interdigital space right.  Objective: Pulses are palpable pain on palpation third interdigital space right foot.  Assessment: Neuroma approximately 50% better right foot.  Plan: Injected her third dose of dehydrated alcohol to the third interdigital space of the right foot.

## 2015-09-12 ENCOUNTER — Ambulatory Visit: Payer: 59 | Admitting: Podiatry

## 2015-09-14 ENCOUNTER — Encounter: Payer: Self-pay | Admitting: Podiatry

## 2015-09-14 ENCOUNTER — Ambulatory Visit (INDEPENDENT_AMBULATORY_CARE_PROVIDER_SITE_OTHER): Payer: 59 | Admitting: Podiatry

## 2015-09-14 DIAGNOSIS — G5761 Lesion of plantar nerve, right lower limb: Secondary | ICD-10-CM

## 2015-09-14 DIAGNOSIS — G5781 Other specified mononeuropathies of right lower limb: Secondary | ICD-10-CM

## 2015-09-14 NOTE — Progress Notes (Signed)
She presents today states that she is proximally 7075% improved that she refers to the neuroma third interspace of the right foot.  Objective: Vital signs are stable alert and oriented 3 palpable Mulder's click third interspace of the right foot strong palpable pulses are noted bilateral.  Assessment: Pain in limb secondary to neuroma third interspace right foot.  Plan: I injected the right neuroma today third interspace right foot with her fourth dose of dehydrated alcohol. Follow up with her in 3 weeks.

## 2015-10-05 ENCOUNTER — Encounter: Payer: Self-pay | Admitting: Podiatry

## 2015-10-05 ENCOUNTER — Ambulatory Visit (INDEPENDENT_AMBULATORY_CARE_PROVIDER_SITE_OTHER): Payer: 59 | Admitting: Podiatry

## 2015-10-05 DIAGNOSIS — G5761 Lesion of plantar nerve, right lower limb: Secondary | ICD-10-CM

## 2015-10-05 DIAGNOSIS — G5781 Other specified mononeuropathies of right lower limb: Secondary | ICD-10-CM

## 2015-10-05 NOTE — Progress Notes (Signed)
She presents today for follow-up of neuroma third interdigital space of the right foot she states is doing some better there's not a lot of improvement but there is some improvement and she refers to the right third interdigital space. It was approximately 7075% last visit but has not moved a lot since then. She complains of a lot of pain after the last injection. That injection was #4.  Objective: Vital signs are stable she is alert and oriented 3 much less tenderness today on palpation to the third interdigital space of the right foot but the palpable Mulder's click still present.  Assessment: Neuroma third interdigital space right foot.  Plan: Reinjected the right third interdigital space today for her fifth injection of dehydrated alcohol. We'll follow up with her in 1 month

## 2015-11-02 ENCOUNTER — Ambulatory Visit (INDEPENDENT_AMBULATORY_CARE_PROVIDER_SITE_OTHER): Payer: 59 | Admitting: Podiatry

## 2015-11-02 ENCOUNTER — Encounter: Payer: Self-pay | Admitting: Podiatry

## 2015-11-02 DIAGNOSIS — G5761 Lesion of plantar nerve, right lower limb: Secondary | ICD-10-CM

## 2015-11-02 DIAGNOSIS — G5781 Other specified mononeuropathies of right lower limb: Secondary | ICD-10-CM

## 2015-11-02 NOTE — Progress Notes (Signed)
She presents today for follow-up of her neuroma third interdigital space of the right foot. She states that it has improved but is still some sore and she reports 95% reproved improvement.  Objective: Vital signs are stable alert and oriented 3. She has palpable Mulder's click third interspace of the right foot. No open lesions or wounds pain and pulses remain palpable.  Assessment: Neuroma third interspace right 95% resolved.  Plan: I injected her 6 dosages dehydrated alcohol to the right foot today we'll follow-up with her in 1 month. Remember to ask out her trip went.

## 2015-11-23 ENCOUNTER — Ambulatory Visit (INDEPENDENT_AMBULATORY_CARE_PROVIDER_SITE_OTHER): Payer: 59 | Admitting: Podiatry

## 2015-11-23 ENCOUNTER — Encounter: Payer: Self-pay | Admitting: Podiatry

## 2015-11-23 DIAGNOSIS — G5781 Other specified mononeuropathies of right lower limb: Secondary | ICD-10-CM

## 2015-11-23 DIAGNOSIS — G5761 Lesion of plantar nerve, right lower limb: Secondary | ICD-10-CM

## 2015-11-25 NOTE — Progress Notes (Signed)
She presents today for follow-up of her neuroma third interdigital space right foot. She states that it is doing very well. She has no problems whatsoever.  Objective: Vital signs are stable alert and oriented 3. Pulses are palpable. No reproducible pain on palpation today third interdigital space right foot.  Assessment: Well-healing neuroma third interdigital space right.  Plan: Follow-up with [place] on an as-needed basis.

## 2016-02-21 DIAGNOSIS — H10413 Chronic giant papillary conjunctivitis, bilateral: Secondary | ICD-10-CM | POA: Diagnosis not present

## 2016-02-21 DIAGNOSIS — H524 Presbyopia: Secondary | ICD-10-CM | POA: Diagnosis not present

## 2016-02-21 DIAGNOSIS — H2513 Age-related nuclear cataract, bilateral: Secondary | ICD-10-CM | POA: Diagnosis not present

## 2016-02-21 DIAGNOSIS — H401132 Primary open-angle glaucoma, bilateral, moderate stage: Secondary | ICD-10-CM | POA: Diagnosis not present

## 2016-02-28 DIAGNOSIS — R197 Diarrhea, unspecified: Secondary | ICD-10-CM | POA: Diagnosis not present

## 2016-02-28 DIAGNOSIS — K921 Melena: Secondary | ICD-10-CM | POA: Diagnosis not present

## 2016-02-28 DIAGNOSIS — K589 Irritable bowel syndrome without diarrhea: Secondary | ICD-10-CM | POA: Diagnosis not present

## 2016-03-06 DIAGNOSIS — K635 Polyp of colon: Secondary | ICD-10-CM | POA: Diagnosis not present

## 2016-03-06 DIAGNOSIS — K529 Noninfective gastroenteritis and colitis, unspecified: Secondary | ICD-10-CM | POA: Diagnosis not present

## 2016-03-06 DIAGNOSIS — R197 Diarrhea, unspecified: Secondary | ICD-10-CM | POA: Diagnosis not present

## 2016-03-12 DIAGNOSIS — K529 Noninfective gastroenteritis and colitis, unspecified: Secondary | ICD-10-CM | POA: Diagnosis not present

## 2016-03-12 DIAGNOSIS — R197 Diarrhea, unspecified: Secondary | ICD-10-CM | POA: Diagnosis not present

## 2016-03-27 DIAGNOSIS — M0589 Other rheumatoid arthritis with rheumatoid factor of multiple sites: Secondary | ICD-10-CM | POA: Diagnosis not present

## 2016-03-27 DIAGNOSIS — Z79899 Other long term (current) drug therapy: Secondary | ICD-10-CM | POA: Diagnosis not present

## 2016-03-27 DIAGNOSIS — M150 Primary generalized (osteo)arthritis: Secondary | ICD-10-CM | POA: Diagnosis not present

## 2016-04-19 DIAGNOSIS — R197 Diarrhea, unspecified: Secondary | ICD-10-CM | POA: Diagnosis not present

## 2016-04-19 DIAGNOSIS — M069 Rheumatoid arthritis, unspecified: Secondary | ICD-10-CM | POA: Diagnosis not present

## 2016-04-19 DIAGNOSIS — K589 Irritable bowel syndrome without diarrhea: Secondary | ICD-10-CM | POA: Diagnosis not present

## 2016-06-18 DIAGNOSIS — Z1231 Encounter for screening mammogram for malignant neoplasm of breast: Secondary | ICD-10-CM | POA: Diagnosis not present

## 2016-06-18 DIAGNOSIS — Z01419 Encounter for gynecological examination (general) (routine) without abnormal findings: Secondary | ICD-10-CM | POA: Diagnosis not present

## 2016-06-28 DIAGNOSIS — M0600 Rheumatoid arthritis without rheumatoid factor, unspecified site: Secondary | ICD-10-CM | POA: Diagnosis not present

## 2016-06-28 DIAGNOSIS — Z79899 Other long term (current) drug therapy: Secondary | ICD-10-CM | POA: Diagnosis not present

## 2016-06-28 DIAGNOSIS — M0589 Other rheumatoid arthritis with rheumatoid factor of multiple sites: Secondary | ICD-10-CM | POA: Diagnosis not present

## 2016-06-28 DIAGNOSIS — M150 Primary generalized (osteo)arthritis: Secondary | ICD-10-CM | POA: Diagnosis not present

## 2016-07-24 DIAGNOSIS — H2513 Age-related nuclear cataract, bilateral: Secondary | ICD-10-CM | POA: Diagnosis not present

## 2016-07-24 DIAGNOSIS — H401132 Primary open-angle glaucoma, bilateral, moderate stage: Secondary | ICD-10-CM | POA: Diagnosis not present

## 2016-07-24 DIAGNOSIS — H10413 Chronic giant papillary conjunctivitis, bilateral: Secondary | ICD-10-CM | POA: Diagnosis not present

## 2016-08-06 DIAGNOSIS — Z0000 Encounter for general adult medical examination without abnormal findings: Secondary | ICD-10-CM | POA: Diagnosis not present

## 2016-08-06 DIAGNOSIS — E782 Mixed hyperlipidemia: Secondary | ICD-10-CM | POA: Diagnosis not present

## 2016-08-06 DIAGNOSIS — I10 Essential (primary) hypertension: Secondary | ICD-10-CM | POA: Diagnosis not present

## 2016-08-06 DIAGNOSIS — Z23 Encounter for immunization: Secondary | ICD-10-CM | POA: Diagnosis not present

## 2016-09-23 DIAGNOSIS — L720 Epidermal cyst: Secondary | ICD-10-CM | POA: Diagnosis not present

## 2016-10-03 DIAGNOSIS — M0600 Rheumatoid arthritis without rheumatoid factor, unspecified site: Secondary | ICD-10-CM | POA: Diagnosis not present

## 2016-10-03 DIAGNOSIS — Z79899 Other long term (current) drug therapy: Secondary | ICD-10-CM | POA: Diagnosis not present

## 2016-10-03 DIAGNOSIS — M150 Primary generalized (osteo)arthritis: Secondary | ICD-10-CM | POA: Diagnosis not present

## 2016-10-10 ENCOUNTER — Emergency Department (HOSPITAL_COMMUNITY)
Admission: EM | Admit: 2016-10-10 | Discharge: 2016-10-11 | Disposition: A | Discharge status: Home / Self Care | Payer: 59 | Attending: Emergency Medicine | Admitting: Emergency Medicine

## 2016-10-10 ENCOUNTER — Encounter (HOSPITAL_COMMUNITY): Payer: Self-pay | Admitting: Emergency Medicine

## 2016-10-10 DIAGNOSIS — Z79899 Other long term (current) drug therapy: Secondary | ICD-10-CM | POA: Insufficient documentation

## 2016-10-10 DIAGNOSIS — E876 Hypokalemia: Secondary | ICD-10-CM

## 2016-10-10 DIAGNOSIS — R112 Nausea with vomiting, unspecified: Secondary | ICD-10-CM | POA: Diagnosis not present

## 2016-10-10 DIAGNOSIS — R531 Weakness: Secondary | ICD-10-CM | POA: Diagnosis not present

## 2016-10-10 DIAGNOSIS — R197 Diarrhea, unspecified: Secondary | ICD-10-CM | POA: Diagnosis present

## 2016-10-10 DIAGNOSIS — F17210 Nicotine dependence, cigarettes, uncomplicated: Secondary | ICD-10-CM | POA: Insufficient documentation

## 2016-10-10 DIAGNOSIS — R404 Transient alteration of awareness: Secondary | ICD-10-CM | POA: Diagnosis not present

## 2016-10-10 DIAGNOSIS — R0789 Other chest pain: Secondary | ICD-10-CM | POA: Diagnosis not present

## 2016-10-10 HISTORY — DX: Unspecified glaucoma: H40.9

## 2016-10-10 LAB — CBC WITH DIFFERENTIAL/PLATELET
Basophils Absolute: 0 10*3/uL (ref 0.0–0.1)
Basophils Relative: 0 %
Eosinophils Absolute: 0.1 10*3/uL (ref 0.0–0.7)
Eosinophils Relative: 1 %
HCT: 40.5 % (ref 36.0–46.0)
Hemoglobin: 14.3 g/dL (ref 12.0–15.0)
Lymphocytes Relative: 5 %
Lymphs Abs: 0.9 10*3/uL (ref 0.7–4.0)
MCH: 35.4 pg — ABNORMAL HIGH (ref 26.0–34.0)
MCHC: 35.3 g/dL (ref 30.0–36.0)
MCV: 100.2 fL — ABNORMAL HIGH (ref 78.0–100.0)
Monocytes Absolute: 1.4 10*3/uL — ABNORMAL HIGH (ref 0.1–1.0)
Monocytes Relative: 8 %
Neutro Abs: 15.3 10*3/uL — ABNORMAL HIGH (ref 1.7–7.7)
Neutrophils Relative %: 86 %
Platelets: 226 10*3/uL (ref 150–400)
RBC: 4.04 MIL/uL (ref 3.87–5.11)
RDW: 14.1 % (ref 11.5–15.5)
WBC: 17.7 10*3/uL — ABNORMAL HIGH (ref 4.0–10.5)

## 2016-10-10 LAB — COMPREHENSIVE METABOLIC PANEL
ALT: 21 U/L (ref 14–54)
AST: 20 U/L (ref 15–41)
Albumin: 3.7 g/dL (ref 3.5–5.0)
Alkaline Phosphatase: 62 U/L (ref 38–126)
Anion gap: 7 (ref 5–15)
BUN: 7 mg/dL (ref 6–20)
CO2: 20 mmol/L — ABNORMAL LOW (ref 22–32)
Calcium: 8.3 mg/dL — ABNORMAL LOW (ref 8.9–10.3)
Chloride: 110 mmol/L (ref 101–111)
Creatinine, Ser: 0.52 mg/dL (ref 0.44–1.00)
GFR calc Af Amer: >60 mL/min (ref >60)
GFR calc non Af Amer: >60 mL/min (ref >60)
Glucose, Bld: 113 mg/dL — ABNORMAL HIGH (ref 65–99)
Potassium: 3.3 mmol/L — ABNORMAL LOW (ref 3.5–5.1)
Sodium: 137 mmol/L (ref 135–145)
Total Bilirubin: 0.4 mg/dL (ref 0.3–1.2)
Total Protein: 6.5 g/dL (ref 6.5–8.1)

## 2016-10-10 MED ORDER — SODIUM CHLORIDE 0.9 % IV BOLUS (SEPSIS)
1000.0000 mL | Freq: Once | INTRAVENOUS | Status: AC
  Administered 2016-10-10: 1000 mL via INTRAVENOUS

## 2016-10-10 MED ORDER — ONDANSETRON 4 MG PO TBDP: 4.0000 mg | ORAL_TABLET | Freq: Three times a day (TID) | ORAL | 0 refills | Status: DC | PRN

## 2016-10-10 MED ORDER — POTASSIUM CHLORIDE CRYS ER 20 MEQ PO TBCR: 20.0000 meq | EXTENDED_RELEASE_TABLET | Freq: Two times a day (BID) | ORAL | 0 refills | Status: DC

## 2016-10-10 MED ORDER — POTASSIUM CHLORIDE CRYS ER 20 MEQ PO TBCR
40.0000 meq | EXTENDED_RELEASE_TABLET | Freq: Once | ORAL | Status: AC
  Administered 2016-10-10: 40 meq via ORAL
  Filled 2016-10-10: qty 2

## 2016-10-10 NOTE — ED Triage Notes (Signed)
Per EMS pt had a cyst removed from her cheek in August and was put on clindamycin  Pt developed diarrhea and stopped the antibiotic on August 30th  Sept 1 she went to get her stitches out and still had the diarrhea  Pt states it eased up for a while but Tuesday of this week it started back and today she has had 10-12 episodes of diarrhea and one episode of vomiting  Pt went to urgent care and was sent here for dehydration   Pt had positive orthostatic changes    Pt states she is weak all over

## 2016-10-10 NOTE — ED Notes (Signed)
Assisted pt to restroom  Tolerated well

## 2016-10-10 NOTE — ED Provider Notes (Addendum)
[facility] DEPT Provider Note   CSN: [id number] Arrival date & time: 10/10/16  2008     History   Chief Complaint Chief Complaint  Patient presents with  . Diarrhea  . Weakness    HPI TERIANNA Bell is a 61 y.o. female.  HPI  61 year old female presents with diarrhea and weakness. Diarrhea started 2 days ago but was not bad. However yesterday seemed to increase in intensity and she developed abdominal cramping. The cramping worsens just prior to a bowel movement. Any type of eating or drinking makes her have to have diarrhea. Now there is yellow in her stool. There is no blood. The abdominal pain has now resolved after being given Zofran by EMS. She went to urgent care and was referred here. No recent travel. She was told she had a temperature of 99 at the urgent care but has not had fevers. She does get chills right before having a bowel movement. No urinary symptoms. She had clindamycin at the end of August for an infected facial cyst that was removed. She took it for about 3 or 4 days but developed diarrhea and stopped the medicine. The diarrhea stopped and has had no diarrhea until 2 days ago. No prior history of C diff. Has felt weak/fatigued and lightheaded when walking. Vomited once today.  Past Medical History:  Diagnosis Date  . Fluid retention   . GERD (gastroesophageal reflux disease)   . Glaucoma   . Osteoarthritis   . Rheumatoid arthritis(714.0)     Patient Active Problem List   Diagnosis Date Noted  . Osteoarthritis of right knee 09/11/2011    Past Surgical History:  Procedure Laterality Date  . CARPAL TUNNEL RELEASE  ~ 2 years   bilateral  . KNEE ARTHROSCOPY  07/17/11   right  . PARTIAL HYSTERECTOMY    . TONSILLECTOMY    . TOTAL KNEE ARTHROPLASTY  09/11/2011   Procedure: TOTAL KNEE ARTHROPLASTY;  Surgeon: Kerin Salen, MD;  Location: [facility];  Service: Orthopedics;  Laterality: Right;  Right total knee Arthroplasty    OB History    No data available        Home Medications    Prior to Admission medications   Medication Sig Start Date End Date Taking? Authorizing Provider  Adalimumab (HUMIRA) 40 MG/0.8ML PSKT Inject 1 application into the skin every 7 (seven) days.   Yes [provider]  b complex vitamins capsule Take 1 capsule by mouth daily.   Yes [provider]  brimonidine-timolol (COMBIGAN) 0.2-0.5 % ophthalmic solution Place 1 drop into both eyes 2 (two) times daily.   Yes [provider]  calcium carbonate (OS-CAL) 1250 MG chewable tablet Chew 1 tablet by mouth daily.   Yes [provider]  cetirizine-pseudoephedrine (ZYRTEC-D) 5-120 MG per tablet Take 1 tablet by mouth daily.   Yes [provider]  Cinnamon 500 MG capsule Take 2,000 mg by mouth daily.   Yes [provider]  diazepam (VALIUM) 5 MG tablet Take 5 mg by mouth every 8 (eight) hours as needed for anxiety.  10/22/13  Yes [provider]  estradiol (ESTRACE) 1 MG tablet Take 1.5 mg by mouth daily.   Yes [provider]  fish oil-omega-3 fatty acids 1000 MG capsule Take 1 g by mouth daily.   Yes [provider]  folic acid (FOLVITE) 1 MG tablet Take 2 mg by mouth daily.   Yes [provider]  methotrexate (RHEUMATREX) 2.5 MG tablet  Take 25 mg by mouth once a week. Caution:Chemotherapy. Protect from light.   Yes [provider]  montelukast (SINGULAIR) 10 MG tablet Take 10 mg by mouth at bedtime.   Yes [provider]  mupirocin ointment (BACTROBAN) 2 % Apply to wound twice a day. 04/18/15  Yes Pirozzi, Max T, DPM  omeprazole (PRILOSEC) 20 MG capsule Take 20 mg by mouth daily.   Yes [provider]  vitamin C (ASCORBIC ACID) 500 MG tablet Take 1,000 mg by mouth daily.   Yes [provider]  vitamin E 200 UNIT capsule Take 200 Units by mouth daily.   Yes [provider]  ondansetron (ZOFRAN ODT) 4 MG disintegrating tablet Take 1 tablet (4 mg  total) by mouth every 8 (eight) hours as needed for nausea or vomiting. 10/10/16   Sherwood Gambler, MD  potassium chloride SA (K-DUR,KLOR-CON) 20 MEQ tablet Take 1 tablet (20 mEq total) by mouth 2 (two) times daily. 10/10/16   Sherwood Gambler, MD    Family History Family History  Problem Relation Age of Onset  . Diabetes Father     Social History Social History  Substance Use Topics  . Smoking status: Current Every Day Smoker    Packs/day: 1.00    Years: 20.00  . Smokeless tobacco: Never Used  . Alcohol use No     Allergies   Penicillins   Review of Systems Review of Systems  Constitutional: Positive for fatigue. Negative for fever.  Gastrointestinal: Positive for abdominal pain, diarrhea, nausea and vomiting (once today). Negative for blood in stool.  Genitourinary: Negative for dysuria.  Neurological: Positive for weakness and light-headedness.  All other systems reviewed and are negative.    Physical Exam Updated Vital Signs BP 111/69 (BP Location: Left Arm)   Pulse 76   Temp 98.6 F (37 C) (Oral)   Resp 16   Ht 5' (1.524 m)   Wt 57.2 kg (126 lb)   SpO2 96%   BMI 24.61 kg/m   Physical Exam  Constitutional: She is oriented to person, place, and time. She appears well-developed and well-nourished.  HENT:  Head: Normocephalic and atraumatic.  Right Ear: External ear normal.  Left Ear: External ear normal.  Nose: Nose normal.  Eyes: Right eye exhibits no discharge. Left eye exhibits no discharge.  Cardiovascular: Normal rate, regular rhythm and normal heart sounds.   Pulmonary/Chest: Effort normal and breath sounds normal.  Abdominal: Soft. She exhibits no distension. There is no tenderness.  Neurological: She is alert and oriented to person, place, and time.  Skin: Skin is warm and dry. She is not diaphoretic.  Nursing note and vitals reviewed.    ED Treatments / Results  Labs (all labs ordered are listed, but only abnormal results are displayed) Labs  Reviewed  COMPREHENSIVE METABOLIC PANEL - Abnormal; Notable for the following:       Result Value   Potassium 3.3 (*)    CO2 20 (*)    Glucose, Bld 113 (*)    Calcium 8.3 (*)    All other components within normal limits  CBC WITH DIFFERENTIAL/PLATELET - Abnormal; Notable for the following:    WBC 17.7 (*)    MCV 100.2 (*)    MCH 35.4 (*)    Neutro Abs 15.3 (*)    Monocytes Absolute 1.4 (*)    All other components within normal limits  C DIFFICILE QUICK SCREEN W PCR REFLEX    EKG  EKG Interpretation None  Radiology No results found.  Procedures Procedures (including critical care time)  Medications Ordered in ED Medications  sodium chloride 0.9 % bolus 1,000 mL (1,000 mLs Intravenous New Bag/Given 10/10/16 2113)  potassium chloride SA (K-DUR,KLOR-CON) CR tablet 40 mEq (40 mEq Oral Given 10/10/16 2219)     Initial Impression / Assessment and Plan / ED Course  I have reviewed the triage vital signs and the nursing notes.  Pertinent labs & imaging results that were available during my care of the patient were reviewed by me and considered in my medical decision making (see chart for details).     Patient has not had any diarrhea since being in the ED. Her presentation is concerning for C. difficile, but I do not think it is concerning enough that she needs prophylactic treatment without any sampling. She has mild hypokalemia, she will be given oral replacement here and on discharge. We will give her a stool sample cup so that she can follow-up as an outpatient for outpatient testing for C. difficile. He could also just be viral gastroenteritis. She complained of abdominal pain originally but when EMS gave her Zofran her abdominal pain resolved. Benign exam here. Feels better with fluids. Has elevated WBC but with no clear bacterial etiology and benign abd exam I don't think further testing needed. Discharge with Zofran, potassium replacement, and discussed symptomatic  care for diarrhea. Discussed return precautions.  Final Clinical Impressions(s) / ED Diagnoses   Final diagnoses:  Diarrhea, unspecified type  Hypokalemia    New Prescriptions New Prescriptions   ONDANSETRON (ZOFRAN ODT) 4 MG DISINTEGRATING TABLET    Take 1 tablet (4 mg total) by mouth every 8 (eight) hours as needed for nausea or vomiting.   POTASSIUM CHLORIDE SA (K-DUR,KLOR-CON) 20 MEQ TABLET    Take 1 tablet (20 mEq total) by mouth 2 (two) times daily.     Sherwood Gambler, MD 10/10/16 4163    Sherwood Gambler, MD 10/10/16 [phone]

## 2016-10-11 DIAGNOSIS — R197 Diarrhea, unspecified: Secondary | ICD-10-CM | POA: Diagnosis not present

## 2016-10-30 DIAGNOSIS — R197 Diarrhea, unspecified: Secondary | ICD-10-CM | POA: Diagnosis not present

## 2016-11-07 DIAGNOSIS — M069 Rheumatoid arthritis, unspecified: Secondary | ICD-10-CM | POA: Diagnosis not present

## 2016-11-07 DIAGNOSIS — A0472 Enterocolitis due to Clostridium difficile, not specified as recurrent: Secondary | ICD-10-CM | POA: Diagnosis not present

## 2016-11-07 DIAGNOSIS — H409 Unspecified glaucoma: Secondary | ICD-10-CM | POA: Diagnosis not present

## 2016-11-20 DIAGNOSIS — H10413 Chronic giant papillary conjunctivitis, bilateral: Secondary | ICD-10-CM | POA: Diagnosis not present

## 2016-11-20 DIAGNOSIS — H2513 Age-related nuclear cataract, bilateral: Secondary | ICD-10-CM | POA: Diagnosis not present

## 2016-11-20 DIAGNOSIS — H401132 Primary open-angle glaucoma, bilateral, moderate stage: Secondary | ICD-10-CM | POA: Diagnosis not present

## 2017-01-02 DIAGNOSIS — Z79899 Other long term (current) drug therapy: Secondary | ICD-10-CM | POA: Diagnosis not present

## 2017-01-02 DIAGNOSIS — M0600 Rheumatoid arthritis without rheumatoid factor, unspecified site: Secondary | ICD-10-CM | POA: Diagnosis not present

## 2017-01-02 DIAGNOSIS — M150 Primary generalized (osteo)arthritis: Secondary | ICD-10-CM | POA: Diagnosis not present

## 2017-02-06 DIAGNOSIS — H2513 Age-related nuclear cataract, bilateral: Secondary | ICD-10-CM | POA: Diagnosis not present

## 2017-02-06 DIAGNOSIS — H401132 Primary open-angle glaucoma, bilateral, moderate stage: Secondary | ICD-10-CM | POA: Diagnosis not present

## 2017-02-06 DIAGNOSIS — H10413 Chronic giant papillary conjunctivitis, bilateral: Secondary | ICD-10-CM | POA: Diagnosis not present

## 2017-02-06 DIAGNOSIS — E7800 Pure hypercholesterolemia, unspecified: Secondary | ICD-10-CM | POA: Diagnosis not present

## 2017-03-24 DIAGNOSIS — J111 Influenza due to unidentified influenza virus with other respiratory manifestations: Secondary | ICD-10-CM | POA: Diagnosis not present

## 2017-04-02 DIAGNOSIS — M0600 Rheumatoid arthritis without rheumatoid factor, unspecified site: Secondary | ICD-10-CM | POA: Diagnosis not present

## 2017-04-02 DIAGNOSIS — M150 Primary generalized (osteo)arthritis: Secondary | ICD-10-CM | POA: Diagnosis not present

## 2017-04-02 DIAGNOSIS — Z79899 Other long term (current) drug therapy: Secondary | ICD-10-CM | POA: Diagnosis not present

## 2017-05-05 ENCOUNTER — Emergency Department (HOSPITAL_COMMUNITY): Payer: 59

## 2017-05-05 ENCOUNTER — Emergency Department (HOSPITAL_COMMUNITY)
Admission: EM | Admit: 2017-05-05 | Discharge: 2017-05-05 | Disposition: A | Discharge status: Home / Self Care | Payer: 59 | Attending: Emergency Medicine | Admitting: Emergency Medicine

## 2017-05-05 DIAGNOSIS — R42 Dizziness and giddiness: Secondary | ICD-10-CM | POA: Insufficient documentation

## 2017-05-05 DIAGNOSIS — Z79899 Other long term (current) drug therapy: Secondary | ICD-10-CM | POA: Insufficient documentation

## 2017-05-05 DIAGNOSIS — F17210 Nicotine dependence, cigarettes, uncomplicated: Secondary | ICD-10-CM | POA: Diagnosis not present

## 2017-05-05 DIAGNOSIS — R112 Nausea with vomiting, unspecified: Secondary | ICD-10-CM | POA: Insufficient documentation

## 2017-05-05 DIAGNOSIS — R404 Transient alteration of awareness: Secondary | ICD-10-CM | POA: Diagnosis not present

## 2017-05-05 LAB — CBC WITH DIFFERENTIAL/PLATELET
Basophils Absolute: 0 10*3/uL (ref 0.0–0.1)
Basophils Relative: 0 %
Eosinophils Absolute: 0.2 10*3/uL (ref 0.0–0.7)
Eosinophils Relative: 3 %
HCT: 40.3 % (ref 36.0–46.0)
Hemoglobin: 13.5 g/dL (ref 12.0–15.0)
Lymphocytes Relative: 20 %
Lymphs Abs: 1.7 10*3/uL (ref 0.7–4.0)
MCH: 34.4 pg — ABNORMAL HIGH (ref 26.0–34.0)
MCHC: 33.5 g/dL (ref 30.0–36.0)
MCV: 102.5 fL — ABNORMAL HIGH (ref 78.0–100.0)
Monocytes Absolute: 0.6 10*3/uL (ref 0.1–1.0)
Monocytes Relative: 7 %
Neutro Abs: 5.9 10*3/uL (ref 1.7–7.7)
Neutrophils Relative %: 70 %
Platelets: 241 10*3/uL (ref 150–400)
RBC: 3.93 MIL/uL (ref 3.87–5.11)
RDW: 14.2 % (ref 11.5–15.5)
WBC: 8.4 10*3/uL (ref 4.0–10.5)

## 2017-05-05 LAB — BASIC METABOLIC PANEL
Anion gap: 11 (ref 5–15)
BUN: 13 mg/dL (ref 6–20)
CO2: 20 mmol/L — ABNORMAL LOW (ref 22–32)
Calcium: 8.9 mg/dL (ref 8.9–10.3)
Chloride: 107 mmol/L (ref 101–111)
Creatinine, Ser: 0.61 mg/dL (ref 0.44–1.00)
GFR calc Af Amer: >60 mL/min (ref >60)
GFR calc non Af Amer: >60 mL/min (ref >60)
Glucose, Bld: 100 mg/dL — ABNORMAL HIGH (ref 65–99)
Potassium: 4.2 mmol/L (ref 3.5–5.1)
Sodium: 138 mmol/L (ref 135–145)

## 2017-05-05 NOTE — ED Provider Notes (Signed)
[facility] EMERGENCY DEPARTMENT Provider Note   CSN: [id number] Arrival date & time: 05/05/17  0841     History   Chief Complaint Chief Complaint  Patient presents with  . Dizziness    HPI Katelyn Bell is a 62 y.o. female with history of GERD, glaucoma, rheumatoid arthritis presents today for evaluation of acute onset, somewhat improving dizziness.  She states that she awoke at around 6:45 AM and found herself to be dizzy upon waking.  She states that she then attempted to get out of bed and get dressed which made her dizziness worse.  She describes the sensation as feeling as though the room is spinning.  She denies any associated vision changes, headache, numbness, tingling, weakness, or tinnitus.  She did note worsening nausea and then had one episode of nonbloody nonbilious emesis.  She states that she felt that her gait was unsteady and she needed to place her hand on the wall to steady herself when ambulating.  She states that her symptoms were alleviated somewhat with sitting still but she remained dizzy.  She states that her symptoms have improved a little bit but she still feels dizzy at rest which worsens with ambulation.  She states that she had vertigo in the past several years ago but she does not remember what that feels like and she cannot tell me if this feels similarly.  She is a current smoker of approximately a pack of cigarettes daily and has been smoking since she was 16.  No medications prior to arrival.  The history is provided by the patient.    Past Medical History:  Diagnosis Date  . Fluid retention   . GERD (gastroesophageal reflux disease)   . Glaucoma   . Osteoarthritis   . Rheumatoid arthritis(714.0)     Patient Active Problem List   Diagnosis Date Noted  . Osteoarthritis of right knee 09/11/2011    Past Surgical History:  Procedure Laterality Date  . CARPAL TUNNEL RELEASE  ~ 2 years   bilateral  . KNEE ARTHROSCOPY  07/17/11   right  . PARTIAL HYSTERECTOMY    . TONSILLECTOMY    . TOTAL KNEE ARTHROPLASTY  09/11/2011   Procedure: TOTAL KNEE ARTHROPLASTY;  Surgeon: Kerin Salen, MD;  Location: [facility];  Service: Orthopedics;  Laterality: Right;  Right total knee Arthroplasty     OB History   None      Home Medications    Prior to Admission medications   Medication Sig Start Date End Date Taking? Authorizing Provider  Adalimumab (HUMIRA) 40 MG/0.8ML PSKT Inject 1 application into the skin every 7 (seven) days.    [provider]  b complex vitamins capsule Take 1 capsule by mouth daily.    [provider]  brimonidine-timolol (COMBIGAN) 0.2-0.5 % ophthalmic solution Place 1 drop into both eyes 2 (two) times daily.    [provider]  calcium carbonate (OS-CAL) 1250 MG chewable tablet Chew 1 tablet by mouth daily.    [provider]  cetirizine-pseudoephedrine (ZYRTEC-D) 5-120 MG per tablet Take 1 tablet by mouth daily.    [provider]  Cinnamon 500 MG capsule Take 2,000 mg by mouth daily.    [provider]  diazepam (VALIUM) 5 MG tablet Take 5 mg by mouth every 8 (eight) hours as needed for anxiety.  10/22/13   [provider]  estradiol (ESTRACE) 1 MG tablet Take 1.5 mg by mouth daily.    [provider]  fish oil-omega-3 fatty acids 1000 MG capsule Take 1 g by mouth daily.    [provider]  folic acid (FOLVITE) 1 MG tablet Take 2 mg by mouth daily.    [provider]  methotrexate (RHEUMATREX) 2.5 MG tablet Take 25 mg by mouth once a week. Caution:Chemotherapy. Protect from light.    [provider]  montelukast (SINGULAIR) 10 MG tablet Take 10 mg by mouth at bedtime.    [provider]  mupirocin ointment (BACTROBAN) 2 % Apply to wound twice a day. 04/18/15   Brew, Max T, DPM  omeprazole (PRILOSEC) 20 MG capsule Take 20 mg by mouth daily.    [provider]  ondansetron (ZOFRAN ODT) 4 MG  disintegrating tablet Take 1 tablet (4 mg total) by mouth every 8 (eight) hours as needed for nausea or vomiting. 10/10/16   Sherwood Gambler, MD  potassium chloride SA (K-DUR,KLOR-CON) 20 MEQ tablet Take 1 tablet (20 mEq total) by mouth 2 (two) times daily. 10/10/16   Sherwood Gambler, MD  vitamin C (ASCORBIC ACID) 500 MG tablet Take 1,000 mg by mouth daily.    [provider]  vitamin E 200 UNIT capsule Take 200 Units by mouth daily.    [provider]    Family History Family History  Problem Relation Age of Onset  . Diabetes Father     Social History Social History   Tobacco Use  . Smoking status: Current Every Day Smoker    Packs/day: 1.00    Years: 20.00    Pack years: 20.00  . Smokeless tobacco: Never Used  Substance Use Topics  . Alcohol use: No    Alcohol/week: 0.0 oz  . Drug use: No     Allergies   Penicillins   Review of Systems Review of Systems  Constitutional: Negative for chills and fever.  Eyes: Negative for photophobia and visual disturbance.  Respiratory: Negative for shortness of breath.   Cardiovascular: Negative for chest pain.  Gastrointestinal: Positive for nausea and vomiting. Negative for abdominal pain, constipation and diarrhea.  Neurological: Positive for dizziness. Negative for syncope, speech difficulty, weakness and headaches.  All other systems reviewed and are negative.    Physical Exam Updated Vital Signs BP 114/70 (BP Location: Left Arm)   Pulse 70   Temp 97.7 F (36.5 C) (Oral)   Resp 18   Ht 5' (1.524 m)   Wt 59 kg (130 lb)   SpO2 97%   BMI 25.39 kg/m   Physical Exam  Constitutional: She is oriented to person, place, and time. She appears well-developed and well-nourished. No distress.  HENT:  Head: Normocephalic and atraumatic.  Eyes: Pupils are equal, round, and reactive to light. Conjunctivae and EOM are normal. Right eye exhibits no discharge. Left eye exhibits no discharge. Right eye exhibits no  nystagmus. Left eye exhibits no nystagmus.  Neck: Normal range of motion. Neck supple. No JVD present. No tracheal deviation present.  Cardiovascular: Normal rate.  Pulmonary/Chest: Effort normal and breath sounds normal.  Abdominal: Soft. Bowel sounds are normal. She exhibits no distension. There is no tenderness.  Musculoskeletal: Normal range of motion. She exhibits no edema or tenderness.  5/5 strength in BUE and BLE major muscle groups  Neurological: She is alert and oriented to person, place, and time. She has normal strength. No cranial nerve deficit or sensory deficit. She exhibits normal muscle tone. She displays a negative Romberg sign. Gait abnormal. GCS eye subscore is 4. GCS verbal subscore is 5.  GCS motor subscore is 6.  Mental Status:  Alert, thought content appropriate, able to give a coherent history. Speech fluent without evidence of aphasia. Able to follow 2 step commands without difficulty.  Cranial Nerves:  II:  Peripheral visual fields grossly normal, pupils equal, round, reactive to light III,IV, VI: ptosis not present, extra-ocular motions intact bilaterally  V,VII: smile symmetric, facial light touch sensation equal VIII: hearing grossly normal to voice  X: uvula elevates symmetrically  XI: bilateral shoulder shrug symmetric and strong XII: midline tongue extension without fassiculations Motor:  Normal tone. 5/5 strength of BUE and BLE major muscle groups including strong and equal grip strength and dorsiflexion/plantar flexion Sensory: light touch normal in all extremities. Cerebellar: normal finger-to-nose with bilateral upper extremities Gait: Mildly unbalanced gait but able to steady herself without difficulty. Able to walk on toes and heels with ease.     Skin: Skin is warm and dry. No erythema.  Psychiatric: She has a normal mood and affect. Her behavior is normal.  Nursing note and vitals reviewed.    ED Treatments / Results  Labs (all labs ordered are  listed, but only abnormal results are displayed) Labs Reviewed  BASIC METABOLIC PANEL - Abnormal; Notable for the following components:      Result Value   CO2 20 (*)    Glucose, Bld 100 (*)    All other components within normal limits  CBC WITH DIFFERENTIAL/PLATELET - Abnormal; Notable for the following components:   MCV 102.5 (*)    MCH 34.4 (*)    All other components within normal limits    EKG EKG Interpretation  Date/Time:  Monday May 05 2017 11:14:19 EDT Ventricular Rate:  61 PR Interval:  188 QRS Duration: 72 QT Interval:  458 QTC Calculation: 461 R Axis:   -16 Text Interpretation:  Normal sinus rhythm Low voltage QRS Cannot rule out Anteroseptal infarct , age undetermined Abnormal ECG No significant change since last tracing Confirmed by Deno Etienne [id number]) on 05/05/2017 11:41:27 AM   Radiology Mr Brain Wo Contrast (neuro Protocol)  Result Date: 05/05/2017 CLINICAL DATA:  Vertigo, persistent, central. EXAM: MRI HEAD WITHOUT CONTRAST TECHNIQUE: Multiplanar, multiecho pulse sequences of the brain and surrounding structures were obtained without intravenous contrast. COMPARISON:  None. FINDINGS: Brain: No acute infarct, hemorrhage, or mass lesion is present. Scattered subcortical T2 hyperintensities are upper limits of normal for age. The internal auditory canals are within normal limits. The brainstem and cerebellum are normal. Vascular: Flow is present in the major intracranial arteries. Skull and upper cervical spine: Skull base is within normal limits. Craniocervical junction is unremarkable. Sinuses/Orbits: The paranasal sinuses and mastoid air cells are clear. Globes and orbits are within normal limits. IMPRESSION: Normal MRI the brain for age. Electronically Signed   By: San Morelle M.D.   On: 05/05/2017 11:13    Procedures Procedures (including critical care time)  Medications Ordered in ED Medications - No data to display   Initial Impression / Assessment  and Plan / ED Course  I have reviewed the triage vital signs and the nursing notes.  Pertinent labs & imaging results that were available during my care of the patient were reviewed by me and considered in my medical decision making (see chart for details).     Patient presents today with an episode of acute onset, progressively improved dizziness upon waking this morning.  She is afebrile, vital signs are at patient's baseline.  She is nontoxic in appearance.  No focal neurologic  deficits on examination aside from very mildly unsteady gait.  Dizziness was present upon waking and steadily improved prior to my assessment.  She notes very mild dizziness at rest at this time.  No nystagmus on examination.  With history and physical examination somewhat concerning for acute intracranial abnormality we will obtain MRI to rule out acute infarct.  Lab work shows no leukocytosis, no significant anemia, no significant electrolyte abnormalities.  EKG shows no significant changes from last tracing and I doubt ACS or MI.  No evidence of arrhythmia.  MRI is normal for the patient's age with no evidence of acute infarct.  On reevaluation the patient is resting comfortably no apparent distress and states that her dizziness has resolved at this time.  She is ambulatory without difficulty.  Differential includes BPPV versus TIA.  I offered the patient meclizine which she declined and states that she is feeling much better.  She will follow-up with her primary care physician on an outpatient basis with possible referral to neurology.  Discussed strict ED return precautions.  Patient, patient's husband, and patient's brother verbalized understanding of and agreement with plan and patient is stable for discharge home at this time.  I discussed the case with Dr. Tyrone Nine who agrees with assessment and plan at this time.  Final Clinical Impressions(s) / ED Diagnoses   Final diagnoses:  Dizziness    ED Discharge Orders     None       Renita Papa, PA-C 05/05/17 [place], Powers, DO 05/06/17 0715

## 2017-05-05 NOTE — ED Triage Notes (Signed)
Pt states she woke up this morning feeling dizzy while she was still lying in her bed, pt states when she stood up it became worse and she felt like she had to hold onto things in order to not fall. Pt made it to the kitchen and after making coffee she became nauseous and vomited once. After this pt called ems, [facility] transported pt here.

## 2017-05-05 NOTE — ED Notes (Signed)
Removed IV placed by [facility] in right a/c per patient request it was causing her pain, cathter intact on removal bleeding controlled, no swelling or bruising noted to site. Pt reports instant pain relief with removal.

## 2017-05-05 NOTE — Discharge Instructions (Signed)
Drink plenty of water and get plenty of rest.  Follow-up with your primary care physician for reevaluation of your symptoms.  You may also follow-up with neurology for reevaluation of your dizziness.  Return to the emergency department if any concerning signs or symptoms develop such as persisting dizziness, persistent vomiting, slurred speech, facial droop, or difficulty walking/weakness

## 2017-05-05 NOTE — ED Notes (Signed)
ED Provider at bedside. 

## 2017-05-05 NOTE — ED Notes (Signed)
Pt verbalized understanding of discharge instructions and denies any further questions at this time.   

## 2017-06-13 DIAGNOSIS — L0390 Cellulitis, unspecified: Secondary | ICD-10-CM | POA: Diagnosis not present

## 2017-07-01 DIAGNOSIS — Z01419 Encounter for gynecological examination (general) (routine) without abnormal findings: Secondary | ICD-10-CM | POA: Diagnosis not present

## 2017-07-01 DIAGNOSIS — Z1231 Encounter for screening mammogram for malignant neoplasm of breast: Secondary | ICD-10-CM | POA: Diagnosis not present

## 2017-07-09 DIAGNOSIS — M150 Primary generalized (osteo)arthritis: Secondary | ICD-10-CM | POA: Diagnosis not present

## 2017-07-09 DIAGNOSIS — M0600 Rheumatoid arthritis without rheumatoid factor, unspecified site: Secondary | ICD-10-CM | POA: Diagnosis not present

## 2017-07-09 DIAGNOSIS — Z79899 Other long term (current) drug therapy: Secondary | ICD-10-CM | POA: Diagnosis not present

## 2017-08-14 DIAGNOSIS — H2513 Age-related nuclear cataract, bilateral: Secondary | ICD-10-CM | POA: Diagnosis not present

## 2017-08-14 DIAGNOSIS — G43809 Other migraine, not intractable, without status migrainosus: Secondary | ICD-10-CM | POA: Diagnosis not present

## 2017-08-14 DIAGNOSIS — H401132 Primary open-angle glaucoma, bilateral, moderate stage: Secondary | ICD-10-CM | POA: Diagnosis not present

## 2017-09-16 ENCOUNTER — Other Ambulatory Visit: Payer: Self-pay | Admitting: Family Medicine

## 2017-09-16 DIAGNOSIS — Z8249 Family history of ischemic heart disease and other diseases of the circulatory system: Secondary | ICD-10-CM

## 2017-09-16 DIAGNOSIS — H409 Unspecified glaucoma: Secondary | ICD-10-CM | POA: Diagnosis not present

## 2017-09-16 DIAGNOSIS — E782 Mixed hyperlipidemia: Secondary | ICD-10-CM | POA: Diagnosis not present

## 2017-09-16 DIAGNOSIS — Z0000 Encounter for general adult medical examination without abnormal findings: Secondary | ICD-10-CM | POA: Diagnosis not present

## 2017-09-16 DIAGNOSIS — I10 Essential (primary) hypertension: Secondary | ICD-10-CM | POA: Diagnosis not present

## 2017-09-23 ENCOUNTER — Ambulatory Visit
Admission: RE | Admit: 2017-09-23 | Discharge: 2017-09-23 | Disposition: A | Discharge status: Home / Self Care | Payer: 59 | Source: Ambulatory Visit | Attending: Family Medicine | Admitting: Family Medicine

## 2017-09-23 DIAGNOSIS — Z8249 Family history of ischemic heart disease and other diseases of the circulatory system: Secondary | ICD-10-CM

## 2017-09-23 DIAGNOSIS — J439 Emphysema, unspecified: Secondary | ICD-10-CM | POA: Diagnosis not present

## 2017-10-09 DIAGNOSIS — M0600 Rheumatoid arthritis without rheumatoid factor, unspecified site: Secondary | ICD-10-CM | POA: Diagnosis not present

## 2017-10-09 DIAGNOSIS — Z79899 Other long term (current) drug therapy: Secondary | ICD-10-CM | POA: Diagnosis not present

## 2017-10-09 DIAGNOSIS — M150 Primary generalized (osteo)arthritis: Secondary | ICD-10-CM | POA: Diagnosis not present

## 2017-10-09 DIAGNOSIS — Z23 Encounter for immunization: Secondary | ICD-10-CM | POA: Diagnosis not present

## 2017-10-27 DIAGNOSIS — M8588 Other specified disorders of bone density and structure, other site: Secondary | ICD-10-CM | POA: Diagnosis not present

## 2017-11-19 ENCOUNTER — Encounter: Payer: Self-pay | Admitting: Internal Medicine

## 2017-11-19 ENCOUNTER — Ambulatory Visit (INDEPENDENT_AMBULATORY_CARE_PROVIDER_SITE_OTHER): Payer: 59 | Admitting: Internal Medicine

## 2017-11-19 VITALS — BP 120/80 | HR 63 | Ht 60.0 in | Wt 132.0 lb

## 2017-11-19 DIAGNOSIS — I25118 Atherosclerotic heart disease of native coronary artery with other forms of angina pectoris: Secondary | ICD-10-CM

## 2017-11-19 DIAGNOSIS — R931 Abnormal findings on diagnostic imaging of heart and coronary circulation: Secondary | ICD-10-CM

## 2017-11-19 DIAGNOSIS — R0600 Dyspnea, unspecified: Secondary | ICD-10-CM

## 2017-11-19 DIAGNOSIS — R0609 Other forms of dyspnea: Secondary | ICD-10-CM

## 2017-11-19 DIAGNOSIS — Z720 Tobacco use: Secondary | ICD-10-CM

## 2017-11-19 MED ORDER — ROSUVASTATIN CALCIUM 20 MG PO TABS: 20.0000 mg | ORAL_TABLET | Freq: Every day | ORAL | 3 refills | Status: DC

## 2017-11-19 MED ORDER — ROSUVASTATIN CALCIUM 5 MG PO TABS: 5.0000 mg | ORAL_TABLET | Freq: Every day | ORAL | 3 refills | Status: DC

## 2017-11-19 NOTE — Patient Instructions (Signed)
Medication Instructions:  Your physician has recommended you make the following change in your medication:  START Crestor 20mg  daily. An Rx has been sent to your pharmacy  If you need a refill on your cardiac medications before your next appointment, please call your pharmacy.   Lab work: None ordered If you have labs (blood work) drawn today and your tests are completely normal, you will receive your results only by: Marland Kitchen MyChart Message (if you have MyChart) OR . A paper copy in the mail If you have any lab test that is abnormal or we need to change your treatment, we will call you to review the results.  Testing/Procedures: .Your physician has requested that you have a stress echocardiogram. For further information please visit [email or web address]. Please follow instruction sheet as given.    Follow-Up: At [facility], you and your health needs are our priority.  As part of our continuing mission to provide you with exceptional heart care, we have created designated Provider Care Teams.  These Care Teams include your primary Cardiologist (physician) and Advanced Practice Providers (APPs -  Physician Assistants and Nurse Practitioners) who all work together to provide you with the care you need, when you need it. You will need a follow up appointment in 3 months.  Please call our office 2 months in advance to schedule this appointment.  You may see Dr.Acharya or one of the following Advanced Practice Providers on your designated Care Team:   Rosaria Ferries, PA-C . Jory Sims, DNP, ANP  Any Other Special Instructions Will Be Listed Below (If Applicable).  Exercise Stress Echocardiogram An exercise stress echocardiogram is a test to check how well your heart is working. This test uses sound waves (ultrasound) and a computer to make images of your heart before and after exercise. Ultrasound images that are taken before you exercise (your resting echocardiogram) will show how much  blood is getting to your heart muscle and how well your heart muscle and heart valves are functioning. During the next part of this test, you will walk on a treadmill or ride a stationary bike to see how exercise affects your heart. While you exercise, the electrical activity of your heart will be monitored with an electrocardiogram (ECG). Your blood pressure will also be monitored. You may have this test if you: Have chest pain or other symptoms of a heart problem. Recently had a heart attack or heart surgery. Have heart valve problems. Have a condition that causes narrowing of the blood vessels that supply your heart (coronary artery disease). Have a high risk of heart disease and are starting a new exercise program. Have a high risk of heart disease and need to have major surgery.  Tell a health care provider about: Any allergies you have. All medicines you are taking, including vitamins, herbs, eye drops, creams, and over-the-counter medicines. Any problems you or family members have had with anesthetic medicines. Any blood disorders you have. Any surgeries you have had. Any medical conditions you have. Whether you are pregnant or may be pregnant. What are the risks? Generally, this is a safe procedure. However, problems may occur, including: Chest pain. Dizziness or light-headedness. Shortness of breath. Increased or irregular heartbeat (palpitations). Nausea or vomiting. Heart attack (very rare).  What happens before the procedure? Follow instructions from your health care provider about eating or drinking restrictions. You may be asked to avoid all forms of caffeine for 24 hours before your procedure, or as told by your health care provider.  Ask your health care provider about changing or stopping your regular medicines. This is especially important if you are taking diabetes medicines or blood thinners. If you use an inhaler, bring it with you to the test. Wear loose,  comfortable clothing and walking shoes. Do notuse any products that contain nicotine or tobacco, such as cigarettes and e-cigarettes, for 4 hours before the test or as told by your health care provider. If you need help quitting, ask your health care provider. What happens during the procedure? You will take off your clothes from the waist up and put on a hospital gown. A technician will place electrodes on your chest. A blood pressure cuff will be placed on your arm. You will lie down on a table for an ultrasound exam before you exercise. Gel will be rubbed on your chest, and a handheld device (transducer) will be pressed against your chest and moved over your heart. Then, you will start exercising by walking on a treadmill or pedaling a stationary bicycle. Your blood pressure and heart rhythm will be monitored while you exercise. The exercise will gradually get harder or faster. You will exercise until: Your heart reaches a target level. You are too tired to continue. You cannot continue because of chest pain, weakness, or dizziness. You will have another ultrasound exam after you stop exercising. The procedure may vary among health care providers and hospitals. What happens after the procedure? Your heart rate and blood pressure will be monitored until they return to your normal levels. Summary An exercise stress echocardiogram is a test that uses ultrasound to check how well your heart works before and after exercise. Before the test, follow instructions from your health care provider about stopping medications, avoiding nicotine and tobacco, and avoiding certain foods and drinks. During the test, your blood pressure and heart rhythm will be monitored while you exercise on a treadmill or stationary bicycle. This information is not intended to replace advice given to you by your health care provider. Make sure you discuss any questions you have with your health care provider. Document  Released: 01/19/2004 Document Revised: 09/06/2015 Document Reviewed: 09/06/2015 Elsevier Interactive Patient Education  2018 [employer].

## 2017-11-19 NOTE — Consult Note (Signed)
Cardiology Office Note:    Date:  11/19/2017   ID:  Katelyn Bell, DOB 05-09-1955, MRN [id number]  PCP:  Mayra Neer, MD  Cardiologist:  No primary care provider on file.  Electrophysiologist:  None   Referring MD: Mayra Neer, MD   Elevated coronary calcium score, dyspnea on exertion  History of Present Illness:    Katelyn Bell is a 62 y.o. female with a hx of GERD, glacoma, and rheumatoid arthritis who presents today for a strong family history of coronary artery disease and an elevated coronary calcium score.  She describes mild dyspnea on exertion as well as some exertional interscapular discomfort.  Her family history includes her sister who had an MI at age 82 as well as both of her parents having MIs at age 5 both requiring bypass, her mother had 31 grafts her father had 4 grafts and her father had diabetes mellitus.  She has no family history of early MI, no family history of sudden cardiac death.  Given her family history she requested screening for coronary artery disease.  She underwent CT coronary calcium study (ECG gated, noncontrast) which revealed a calcium score of 301 which places her in the 95th percentile for age and gender matched peers.  She is a active smoker with a 47-pack-year history of smoking.  She denies significant alcohol use, especially while being on methotrexate.  She denies recreational drug use and denies the use of herbal supplements or diet products.  She walks 25 miles a week and notes that she walks at least 4 miles at a time.  After about 1 mile of walking on hills she will notice that she gets dyspnea on exertion which she is able to walk through without stopping.  She gets an interscapular back discomfort around the same time as the shortness of breath on occasion which also she walks through and goes away on its own.  With these symptoms she is concerned about coronary artery disease.  She denies chest pain, chest pressure, dyspnea  at rest, palpitations, PND, orthopnea, or leg swelling. Denies presyncope.  She describes one lifetime syncopal episode while witnessing a blood draw, but has otherwise not had syncope.   Past Medical History:  Diagnosis Date  . Fluid retention   . GERD (gastroesophageal reflux disease)   . Glaucoma   . Osteoarthritis   . Rheumatoid arthritis(714.0)     Past Surgical History:  Procedure Laterality Date  . CARPAL TUNNEL RELEASE  ~ 2 years   bilateral  . KNEE ARTHROSCOPY  07/17/11   right  . PARTIAL HYSTERECTOMY    . TONSILLECTOMY    . TOTAL KNEE ARTHROPLASTY  09/11/2011   Procedure: TOTAL KNEE ARTHROPLASTY;  Surgeon: Kerin Salen, MD;  Location: [facility];  Service: Orthopedics;  Laterality: Right;  Right total knee Arthroplasty    Current Medications: Current Meds  Medication Sig  . Adalimumab (HUMIRA) 40 MG/0.8ML PSKT Inject 1 application into the skin every 7 (seven) days.  Marland Kitchen b complex vitamins capsule Take 1 capsule by mouth daily.  . brimonidine-timolol (COMBIGAN) 0.2-0.5 % ophthalmic solution Place 1 drop into both eyes 2 (two) times daily.  . calcium carbonate (OS-CAL) 1250 MG chewable tablet Chew 1 tablet by mouth daily.  . Cetirizine HCl (ZYRTEC ALLERGY PO) Take by mouth.  . Cinnamon 500 MG capsule Take 2,000 mg by mouth daily.  Marland Kitchen estradiol (ESTRACE) 1 MG tablet Take 1.5 mg by mouth daily.  . fish oil-omega-3 fatty acids 1000  MG capsule Take 1 g by mouth daily.  . folic acid (FOLVITE) 1 MG tablet Take 2 mg by mouth daily.  . methotrexate (RHEUMATREX) 2.5 MG tablet Take 25 mg by mouth once a week. Caution:Chemotherapy. Protect from light.  . montelukast (SINGULAIR) 10 MG tablet Take 10 mg by mouth at bedtime.  . mupirocin ointment (BACTROBAN) 2 % Apply to wound twice a day.  Marland Kitchen omeprazole (PRILOSEC) 20 MG capsule Take 20 mg by mouth daily.  . vitamin C (ASCORBIC ACID) 500 MG tablet Take 1,000 mg by mouth daily.  . vitamin E 200 UNIT capsule Take 200 Units by mouth daily.       Allergies:   Clindamycin/lincomycin and Penicillins   Social History   Socioeconomic History  . Marital status: Married    Spouse name: Not on file  . Number of children: Not on file  . Years of education: Not on file  . Highest education level: Not on file  Occupational History  . Not on file  Social Needs  . Financial resource strain: Not on file  . Food insecurity:    Worry: Not on file    Inability: Not on file  . Transportation needs:    Medical: Not on file    Non-medical: Not on file  Tobacco Use  . Smoking status: Current Every Day Smoker    Packs/day: 1.00    Years: 20.00    Pack years: 20.00  . Smokeless tobacco: Never Used  Substance and Sexual Activity  . Alcohol use: No    Alcohol/week: 0.0 standard drinks  . Drug use: No  . Sexual activity: Not on file  Lifestyle  . Physical activity:    Days per week: Not on file    Minutes per session: Not on file  . Stress: Not on file  Relationships  . Social connections:    Talks on phone: Not on file    Gets together: Not on file    Attends religious service: Not on file    Active member of club or organization: Not on file    Attends meetings of clubs or organizations: Not on file    Relationship status: Not on file  Other Topics Concern  . Not on file  Social History Narrative  . Not on file     Family History: The patient'sfamily history includes sister with an MI at age 39, parents both having an MI at age 57, mother and father both having coronary artery bypass surgery. family history includes Diabetes in her father.  ROS:   Please see the history of present illness.    All other systems reviewed and are negative.  EKGs/Labs/Other Studies Reviewed:    The following studies were reviewed today:  EKG:  EKG is ordered today.  The ekg ordered today demonstrates normal sinus rhythm with possible small Q waves in the anteroseptal leads.  Ventricular rate 63 bpm.  Recent Labs: 05/05/2017: BUN 13;  Creatinine, Ser 0.61; Hemoglobin 13.5; Platelets 241; Potassium 4.2; Sodium 138  Recent Lipid Panel No results found for: CHOL, TRIG, HDL, CHOLHDL, VLDL, LDLCALC, LDLDIRECT  Physical Exam:    VS:  BP 120/80   Pulse 63   Ht 5' (1.524 m)   Wt 132 lb (59.9 kg)   BMI 25.78 kg/m     Wt Readings from Last 3 Encounters:  11/19/17 132 lb (59.9 kg)  05/05/17 130 lb (59 kg)  10/10/16 126 lb (57.2 kg)     GEN: Well nourished,  well developed in no acute distress HEENT: Normal NECK: No JVD; No carotid bruits LYMPHATICS: No lymphadenopathy CARDIAC: RRR, possible 1/6 holodiastolic murmur heard at the left upper sternal border RESPIRATORY:  Clear to auscultation without rales, wheezing or rhonchi  ABDOMEN: Soft, non-tender, non-distended MUSCULOSKELETAL:  No edema; No deformity  SKIN: Warm and dry NEUROLOGIC:  Alert and oriented x 3 PSYCHIATRIC:  Normal affect   ASSESSMENT:    1. DOE (dyspnea on exertion)   2. Tobacco abuse   3. Agatston coronary artery calcium score between 200 and 399   4. 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  If she were asymptomatic we would pursue lifestyle modification primarily.  However she is having dyspnea on exertion and and intrascapular back pain with exertion that she says her mother had a similar symptom prior to her MIs.  With this in mind we will pursue a stress echocardiography.  She can definitely exercise on the treadmill and we will perform stress in that manner.  I have encouraged her to report symptoms if she feels them during her stress test.  For secondary risk reduction of coronary artery disease which is seen on coronary calcium study, I have recommended moderate-high intensity statin therapy with Crestor 20 mg daily.  I have counseled her on  myalgias and have asked her to report if she has symptoms.  If she does we can consider scaling back to 5 mg a day or less and uptitrating as able.  I will follow-up the results of stress echocardiography.  If it is equivocal we can certainly consider a CT coronary angiogram with FFR to define coronary anatomy and assess any lesions for functional stenosis.   Medication Adjustments/Labs and Tests Ordered: Current medicines are reviewed at length with the patient today.  Concerns regarding medicines are outlined above.  Orders Placed This Encounter  Procedures  . EKG 12-Lead  . ECHOCARDIOGRAM STRESS TEST   Meds ordered this encounter  Medications      . rosuvastatin (CRESTOR) 20 MG tablet    Sig: Take 1 tablet (20 mg total) by mouth daily.    Dispense:  90 tablet    Refill:  3    Please disregard the prior escribe. The dosage is incorrect    Patient Instructions  Medication Instructions:  Your physician has recommended you make the following change in your medication:  START Crestor 20mg  daily. An Rx has been sent to your pharmacy  If you need a refill on your cardiac medications before your next appointment, please call your pharmacy.   Lab work: None ordered If you have labs (blood work) drawn today and your tests are completely normal, you will receive your results only by: Marland Kitchen MyChart Message (if you have MyChart) OR . A paper copy in the mail If you have any lab test that is abnormal or we need to change your treatment, we will call you to review the results.  Testing/Procedures: .Your physician has requested that you have a stress echocardiogram. For further information please visit [email or web address]. Please follow instruction sheet as given.    Follow-Up: At [facility], you and your health needs are our priority.  As part of our continuing mission to provide you with exceptional heart care, we have created designated Provider Care Teams.  These  Care Teams include  your primary Cardiologist (physician) and Advanced Practice Providers (APPs -  Physician Assistants and Nurse Practitioners) who all work together to provide you with the care you need, when you need it. You will need a follow up appointment in 3 months.  Please call our office 2 months in advance to schedule this appointment.  You may see Dr.Acharya or one of the following Advanced Practice Providers on your designated Care Team:   Rosaria Ferries, PA-C . Jory Sims, DNP, ANP  Any Other Special Instructions Will Be Listed Below (If Applicable).  Exercise Stress Echocardiogram An exercise stress echocardiogram is a test to check how well your heart is working. This test uses sound waves (ultrasound) and a computer to make images of your heart before and after exercise. Ultrasound images that are taken before you exercise (your resting echocardiogram) will show how much blood is getting to your heart muscle and how well your heart muscle and heart valves are functioning. During the next part of this test, you will walk on a treadmill or ride a stationary bike to see how exercise affects your heart. While you exercise, the electrical activity of your heart will be monitored with an electrocardiogram (ECG). Your blood pressure will also be monitored. You may have this test if you: Have chest pain or other symptoms of a heart problem. Recently had a heart attack or heart surgery. Have heart valve problems. Have a condition that causes narrowing of the blood vessels that supply your heart (coronary artery disease). Have a high risk of heart disease and are starting a new exercise program. Have a high risk of heart disease and need to have major surgery.  Tell a health care provider about: Any allergies you have. All medicines you are taking, including vitamins, herbs, eye drops, creams, and over-the-counter medicines. Any problems you or family members have had with anesthetic medicines. Any  blood disorders you have. Any surgeries you have had. Any medical conditions you have. Whether you are pregnant or may be pregnant. What are the risks? Generally, this is a safe procedure. However, problems may occur, including: Chest pain. Dizziness or light-headedness. Shortness of breath. Increased or irregular heartbeat (palpitations). Nausea or vomiting. Heart attack (very rare).  What happens before the procedure? Follow instructions from your health care provider about eating or drinking restrictions. You may be asked to avoid all forms of caffeine for 24 hours before your procedure, or as told by your health care provider. Ask your health care provider about changing or stopping your regular medicines. This is especially important if you are taking diabetes medicines or blood thinners. If you use an inhaler, bring it with you to the test. Wear loose, comfortable clothing and walking shoes. Do notuse any products that contain nicotine or tobacco, such as cigarettes and e-cigarettes, for 4 hours before the test or as told by your health care provider. If you need help quitting, ask your health care provider. What happens during the procedure? You will take off your clothes from the waist up and put on a hospital gown. A technician will place electrodes on your chest. A blood pressure cuff will be placed on your arm. You will lie down on a table for an ultrasound exam before you exercise. Gel will be rubbed on your chest, and a handheld device (transducer) will be pressed against your chest and moved over your heart. Then, you will start exercising by walking on a treadmill or pedaling a stationary bicycle.  Your blood pressure and heart rhythm will be monitored while you exercise. The exercise will gradually get harder or faster. You will exercise until: Your heart reaches a target level. You are too tired to continue. You cannot continue because of chest pain, weakness, or  dizziness. You will have another ultrasound exam after you stop exercising. The procedure may vary among health care providers and hospitals. What happens after the procedure? Your heart rate and blood pressure will be monitored until they return to your normal levels. Summary An exercise stress echocardiogram is a test that uses ultrasound to check how well your heart works before and after exercise. Before the test, follow instructions from your health care provider about stopping medications, avoiding nicotine and tobacco, and avoiding certain foods and drinks. During the test, your blood pressure and heart rhythm will be monitored while you exercise on a treadmill or stationary bicycle. This information is not intended to replace advice given to you by your health care provider. Make sure you discuss any questions you have with your health care provider. Document Released: 01/19/2004 Document Revised: 09/06/2015 Document Reviewed: 09/06/2015 Elsevier Interactive Patient Education  2018 [place], Elouise Munroe, MD  11/19/2017 1:17 PM    Gas

## 2017-11-24 ENCOUNTER — Telehealth (HOSPITAL_COMMUNITY): Payer: Self-pay

## 2017-11-24 NOTE — Telephone Encounter (Signed)
Pt contacted and instructions given. Pt stated that she understood and would be here. S.Mita Vallo EMTP

## 2017-11-27 ENCOUNTER — Ambulatory Visit (HOSPITAL_COMMUNITY): Payer: 59 | Attending: Cardiology

## 2017-11-27 ENCOUNTER — Other Ambulatory Visit: Payer: Self-pay

## 2017-11-27 ENCOUNTER — Ambulatory Visit (HOSPITAL_BASED_OUTPATIENT_CLINIC_OR_DEPARTMENT_OTHER): Payer: 59

## 2017-11-27 DIAGNOSIS — R0609 Other forms of dyspnea: Secondary | ICD-10-CM | POA: Insufficient documentation

## 2017-11-27 DIAGNOSIS — R0600 Dyspnea, unspecified: Secondary | ICD-10-CM

## 2017-12-17 ENCOUNTER — Telehealth: Payer: Self-pay | Admitting: Internal Medicine

## 2017-12-17 NOTE — Telephone Encounter (Signed)
New Message:    Patient concerning some medication that she is now taken the name of the medication rosuvastatin 20 mg. Patient takes medication in the evening and she wakes up feeling dizzy. Patient would like for some one to call her back.

## 2017-12-17 NOTE — Telephone Encounter (Signed)
Spoke with patient and she has been experiencing dizziness in the morning when she get up that subsides mid morning. She has a history of vertigo but this does not feel the same. She does not check her blood pressure at home. She stated the only thing different is the Rosuvastatin. Reviewed potential side effects and this is listed. Explained to patient this is not a common side effect but she could hold Rosuvastatin for 2 weeks to see if improvement. If no improvement or worse contact PCP. After 2 weeks if improvement restart Rosuvastatin and call back if any issues at that time.

## 2017-12-17 NOTE — Telephone Encounter (Signed)
Agree with recommendations provided to patient.

## 2017-12-18 ENCOUNTER — Ambulatory Visit (INDEPENDENT_AMBULATORY_CARE_PROVIDER_SITE_OTHER): Payer: 59 | Admitting: Podiatry

## 2017-12-18 DIAGNOSIS — G5781 Other specified mononeuropathies of right lower limb: Secondary | ICD-10-CM

## 2017-12-18 DIAGNOSIS — G5761 Lesion of plantar nerve, right lower limb: Secondary | ICD-10-CM | POA: Diagnosis not present

## 2017-12-18 NOTE — Progress Notes (Signed)
She presents today for follow-up of neuroma third interspace of the right foot.  The neuroma is back and she would like to have it removed.  She states that he still has to get through all those injections again.  Objective: Vital signs are stable she is alert and oriented x3.  Pulses are palpable.  Neurologic sensorium is intact palpable Mulder's click third interspace of the right foot.  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  Plan: Consented her today for a neurectomy third interspace of the right foot we did discuss the possible postop complications which may include but are not limited to postop pain bleeding swelling infection recurrence need for further surgery overcorrection loss of digit loss of limb loss of life loss of sensation between the toes.  Delayed healing secondary to her smoking or due to the procedure itself.  Provided her with information regarding the surgery center anesthesia and preop.  Follow-up with me at the time of surgery.

## 2017-12-18 NOTE — Patient Instructions (Signed)
Pre-Operative Instructions  Congratulations, you have decided to take an important step towards improving your quality of life.  You can be assured that the doctors and staff at [facility] will be with you every step of the way.  Here are some important things you should know:  1. Plan to be at the surgery center/hospital at least 1 (one) hour prior to your scheduled time, unless otherwise directed by the surgical center/hospital staff.  You must have a responsible adult accompany you, remain during the surgery and drive you home.  Make sure you have directions to the surgical center/hospital to ensure you arrive on time. 2. If you are having surgery at [facility] or [facility], you will need a copy of your medical history and physical form from your family physician within one month prior to the date of surgery. We will give you a form for your primary physician to complete.  3. We make every effort to accommodate the date you request for surgery.  However, there are times where surgery dates or times have to be moved.  We will contact you as soon as possible if a change in schedule is required.   4. No aspirin/ibuprofen for one week before surgery.  If you are on aspirin, any non-steroidal anti-inflammatory medications (Mobic, Aleve, Ibuprofen) should not be taken seven (7) days prior to your surgery.  You make take Tylenol for pain prior to surgery.  5. Medications - If you are taking daily heart and blood pressure medications, seizure, reflux, allergy, asthma, anxiety, pain or diabetes medications, make sure you notify the surgery center/hospital before the day of surgery so they can tell you which medications you should take or avoid the day of surgery. 6. No food or drink after midnight the night before surgery unless directed otherwise by surgical center/hospital staff. 7. No alcoholic beverages 24-hours prior to surgery.  No smoking 24-hours prior or 24-hours after  surgery. 8. Wear loose pants or shorts. They should be loose enough to fit over bandages, boots, and casts. 9. Don't wear slip-on shoes. Sneakers are preferred. 10. Bring your boot with you to the surgery center/hospital.  Also bring crutches or a walker if your physician has prescribed it for you.  If you do not have this equipment, it will be provided for you after surgery. 11. If you have not been contacted by the surgery center/hospital by the day before your surgery, call to confirm the date and time of your surgery. 12. Leave-time from work may vary depending on the type of surgery you have.  Appropriate arrangements should be made prior to surgery with your employer. 13. Prescriptions will be provided immediately following surgery by your doctor.  Fill these as soon as possible after surgery and take the medication as directed. Pain medications will not be refilled on weekends and must be approved by the doctor. 14. Remove nail polish on the operative foot and avoid getting pedicures prior to surgery. 15. Wash the night before surgery.  The night before surgery wash the foot and leg well with water and the antibacterial soap provided. Be sure to pay special attention to beneath the toenails and in between the toes.  Wash for at least three (3) minutes. Rinse thoroughly with water and dry well with a towel.  Perform this wash unless told not to do so by your physician.  Enclosed: 1 Ice pack (please put in freezer the night before surgery)   1 Hibiclens skin cleaner     Pre-op instructions  If you have any questions regarding the instructions, please do not hesitate to call our office.  [facility]: [address], [place], [place] [address] -- [phone]  [facility] [place]: [address] [place], [place] [address] -- [phone]  [place]: [address]  [place], [place] [address] -- [phone]  [place]: [address], [address], [place], [place] [address] -- [phone]  Website:  [email or web address] 

## 2017-12-21 DIAGNOSIS — H6500 Acute serous otitis media, unspecified ear: Secondary | ICD-10-CM | POA: Diagnosis not present

## 2017-12-21 DIAGNOSIS — H81399 Other peripheral vertigo, unspecified ear: Secondary | ICD-10-CM | POA: Diagnosis not present

## 2018-01-08 DIAGNOSIS — M0589 Other rheumatoid arthritis with rheumatoid factor of multiple sites: Secondary | ICD-10-CM | POA: Diagnosis not present

## 2018-01-08 DIAGNOSIS — Z79899 Other long term (current) drug therapy: Secondary | ICD-10-CM | POA: Diagnosis not present

## 2018-01-13 ENCOUNTER — Telehealth: Payer: Self-pay | Admitting: *Deleted

## 2018-01-13 NOTE — Telephone Encounter (Signed)
"  I have surgery scheduled for Friday at the surgical center.  I have a few concerns.  Can you call me back?"

## 2018-01-14 ENCOUNTER — Other Ambulatory Visit: Payer: Self-pay | Admitting: Podiatry

## 2018-01-14 MED ORDER — ONDANSETRON HCL 4 MG PO TABS: 4.0000 mg | ORAL_TABLET | Freq: Three times a day (TID) | ORAL | 0 refills | Status: AC | PRN

## 2018-01-14 MED ORDER — DOXYCYCLINE HYCLATE 100 MG PO TABS: 100.0000 mg | ORAL_TABLET | Freq: Two times a day (BID) | ORAL | 0 refills | Status: DC

## 2018-01-14 MED ORDER — OXYCODONE-ACETAMINOPHEN 10-325 MG PO TABS: 1.0000 | ORAL_TABLET | Freq: Four times a day (QID) | ORAL | 0 refills | Status: AC | PRN

## 2018-01-15 NOTE — Telephone Encounter (Signed)
I am returning your call.  You have some concerns?  "Well my allergies have been acting up a little bit.  I have been taking my allergy medicine."  What symptoms are you having?  Are you congested or coughing a lot?  "I am coughing a little, I have a little congestion but not much.  I don't have a fever or anything like that."  You may be okay to have the surgery.  The anesthesiologist will let you know tomorrow it there's a concern.  "Okay, thank you."

## 2018-01-16 ENCOUNTER — Encounter: Payer: Self-pay | Admitting: Podiatry

## 2018-01-16 DIAGNOSIS — G5761 Lesion of plantar nerve, right lower limb: Secondary | ICD-10-CM | POA: Diagnosis not present

## 2018-01-22 ENCOUNTER — Encounter: Payer: Self-pay | Admitting: Podiatry

## 2018-01-22 ENCOUNTER — Ambulatory Visit (INDEPENDENT_AMBULATORY_CARE_PROVIDER_SITE_OTHER): Payer: 59 | Admitting: Podiatry

## 2018-01-22 DIAGNOSIS — Z09 Encounter for follow-up examination after completed treatment for conditions other than malignant neoplasm: Secondary | ICD-10-CM

## 2018-01-22 NOTE — Progress Notes (Signed)
This patient presents the office 1 week after the removal of a neuroma third interspace right foot.  She states she is not having much pain or discomfort.  The bandage is intact she says she initially took Percocet for pain control but has only taken 1 Advil approximately 2 days ago.  She presents the office today for her first postoperative visit.  Objective good wound correction noted over the third interspace of the right foot.  No evidence of any swelling or fluctuance noted.  Ecchymosis is noted through all the digits on the right foot.  Status post neuroma resection third interspace right foot.  Postoperative visit #1.  Patient is doing well and no evidence of any infection noted at the surgical site.  The surgical site was then re-bandaged and she was instructed continue to ambulate with her surgical shoe.  Return to the office in 1 week for her next postoperative visit.  Patient to call the office any problems occur.   Gardiner Barefoot DPM

## 2018-01-23 ENCOUNTER — Encounter: Payer: Self-pay | Admitting: Podiatry

## 2018-01-29 ENCOUNTER — Ambulatory Visit (INDEPENDENT_AMBULATORY_CARE_PROVIDER_SITE_OTHER): Payer: Self-pay | Admitting: Podiatry

## 2018-01-29 DIAGNOSIS — G5761 Lesion of plantar nerve, right lower limb: Secondary | ICD-10-CM

## 2018-01-29 DIAGNOSIS — Z9889 Other specified postprocedural states: Secondary | ICD-10-CM

## 2018-01-29 DIAGNOSIS — G5781 Other specified mononeuropathies of right lower limb: Secondary | ICD-10-CM

## 2018-01-29 NOTE — Progress Notes (Signed)
Subjective:  Patient ID: Katelyn Bell, female    DOB: 1955-09-24,  MRN: [id number]  Chief Complaint  Patient presents with  . Routine Post Op    DOS: 01/16/2018 Neurectomy 3rd Rt   "Its feeling pretty good"    DOS: 01/16/18 Procedure: Excision interdigital neuroma right foot  63 y.o. female returns for post-op check.  Doing well states the area is feeling okay been applying antibiotic ointment over the incision  Review of Systems: Negative except as noted in the HPI. Denies N/V/F/Ch.  Past Medical History:  Diagnosis Date  . Fluid retention   . GERD (gastroesophageal reflux disease)   . Glaucoma   . Osteoarthritis   . Rheumatoid arthritis(714.0)     Current Outpatient Medications:  .  Adalimumab (HUMIRA) 40 MG/0.8ML PSKT, Inject 1 application into the skin every 7 (seven) days., Disp: , Rfl:  .  brimonidine-timolol (COMBIGAN) 0.2-0.5 % ophthalmic solution, Place 1 drop into both eyes 2 (two) times daily., Disp: , Rfl:  .  calcium carbonate (OS-CAL) 1250 MG chewable tablet, Chew 1 tablet by mouth daily., Disp: , Rfl:  .  Cetirizine HCl (ZYRTEC ALLERGY PO), Take by mouth., Disp: , Rfl:  .  Cinnamon 500 MG capsule, Take 2,000 mg by mouth daily., Disp: , Rfl:  .  diazepam (VALIUM) 5 MG tablet, Take 5 mg by mouth every 8 (eight) hours as needed for anxiety. , Disp: , Rfl: 0 .  doxycycline (VIBRA-TABS) 100 MG tablet, Take 1 tablet (100 mg total) by mouth 2 (two) times daily., Disp: 20 tablet, Rfl: 0 .  estradiol (ESTRACE) 1 MG tablet, Take 1.5 mg by mouth daily., Disp: , Rfl:  .  fish oil-omega-3 fatty acids 1000 MG capsule, Take 1 g by mouth daily., Disp: , Rfl:  .  folic acid (FOLVITE) 1 MG tablet, Take 2 mg by mouth daily., Disp: , Rfl:  .  methotrexate (RHEUMATREX) 2.5 MG tablet, Take 25 mg by mouth once a week. Caution:Chemotherapy. Protect from light., Disp: , Rfl:  .  montelukast (SINGULAIR) 10 MG tablet, Take 10 mg by mouth at bedtime., Disp: , Rfl:  .  mupirocin  ointment (BACTROBAN) 2 %, Apply to wound twice a day., Disp: 30 g, Rfl: 2 .  omeprazole (PRILOSEC) 20 MG capsule, Take 20 mg by mouth daily., Disp: , Rfl:  .  ondansetron (ZOFRAN) 4 MG tablet, Take 1 tablet (4 mg total) by mouth every 8 (eight) hours as needed for nausea or vomiting., Disp: 20 tablet, Rfl: 0 .  rosuvastatin (CRESTOR) 20 MG tablet, Take 1 tablet (20 mg total) by mouth daily., Disp: 90 tablet, Rfl: 3 .  vitamin C (ASCORBIC ACID) 500 MG tablet, Take 1,000 mg by mouth daily., Disp: , Rfl:  .  vitamin E 200 UNIT capsule, Take 200 Units by mouth daily., Disp: , Rfl:   Social History   Tobacco Use  Smoking Status Current Every Day Smoker  . Packs/day: 1.00  . Years: 20.00  . Pack years: 20.00  Smokeless Tobacco Never Used    Allergies  Allergen Reactions  . Clindamycin/Lincomycin Diarrhea  . Penicillins Hives    Has patient had a PCN reaction causing immediate rash, facial/tongue/throat swelling, SOB or lightheadedness with hypotension: no Has patient had a PCN reaction causing severe rash involving mucus membranes or skin necrosis:no Has patient had a PCN reaction that required hospitalization: no Has patient had a PCN reaction occurring within the last 10 years: noIf all of the above answers are "NO",  then may proceed with Cephalosporin use.    Objective:  There were no vitals filed for this visit. There is no height or weight on file to calculate BMI. Constitutional Well developed. Well nourished.  Vascular Foot warm and well perfused. Capillary refill normal to all digits.   Neurologic Normal speech. Oriented to person, place, and time. Epicritic sensation to light touch grossly present bilaterally.  Dermatologic Skin healing well without signs of infection. Skin edges well coapted without signs of infection.  Incision slightly macerated  Orthopedic: Tenderness to palpation noted about the surgical site.   Radiographs: None Assessment:   1. 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 Status post right foot surgery    Plan:  Patient was evaluated and treated and all questions answered.  S/p foot surgery right -Progressing as expected post-operatively. -XR: None -WB Status: Weight-bear as tolerated in surgical shoe -Sutures: Intact.  Betadine applied along incision followed by Steri-Strips.  Okay to shower but not soak. -Medications: None refilled today -Foot redressed.  Return in about 3 weeks (around 02/19/2018) for [facility] patient.

## 2018-02-12 ENCOUNTER — Other Ambulatory Visit: Payer: 59

## 2018-02-19 ENCOUNTER — Encounter: Payer: Self-pay | Admitting: Podiatry

## 2018-02-19 ENCOUNTER — Ambulatory Visit (INDEPENDENT_AMBULATORY_CARE_PROVIDER_SITE_OTHER): Payer: 59 | Admitting: Podiatry

## 2018-02-19 DIAGNOSIS — Z9889 Other specified postprocedural states: Secondary | ICD-10-CM

## 2018-02-19 DIAGNOSIS — G5781 Other specified mononeuropathies of right lower limb: Secondary | ICD-10-CM

## 2018-02-19 DIAGNOSIS — G5761 Lesion of plantar nerve, right lower limb: Secondary | ICD-10-CM

## 2018-02-19 NOTE — Progress Notes (Signed)
She presents today for a postop visit date of surgery 01/16/2018 status status post neurectomy third interdigital space right foot.  States that he feels pretty good really not having problems with it.  Objective: Vital signs are stable alert oriented x3.  There is minimal edema no erythema cellulitis drainage or odor.  Pulses are palpable.  Neurologic sensorium is intact except to the third interdigital space of the right foot.  Assessment: Well-healing surgical foot right.  Plan: Follow-up with [place] on an as-needed basis.

## 2018-02-20 DIAGNOSIS — H401132 Primary open-angle glaucoma, bilateral, moderate stage: Secondary | ICD-10-CM | POA: Diagnosis not present

## 2018-02-24 ENCOUNTER — Encounter: Payer: Self-pay | Admitting: Internal Medicine

## 2018-02-24 ENCOUNTER — Ambulatory Visit (INDEPENDENT_AMBULATORY_CARE_PROVIDER_SITE_OTHER): Payer: 59 | Admitting: Internal Medicine

## 2018-02-24 VITALS — BP 102/80 | HR 73 | Ht 60.0 in | Wt 134.2 lb

## 2018-02-24 DIAGNOSIS — R931 Abnormal findings on diagnostic imaging of heart and coronary circulation: Secondary | ICD-10-CM

## 2018-02-24 DIAGNOSIS — Z720 Tobacco use: Secondary | ICD-10-CM | POA: Diagnosis not present

## 2018-02-24 DIAGNOSIS — I25118 Atherosclerotic heart disease of native coronary artery with other forms of angina pectoris: Secondary | ICD-10-CM | POA: Diagnosis not present

## 2018-02-24 NOTE — Patient Instructions (Signed)
Medication Instructions:  Your physician recommends that you continue on your current medications as directed. Please refer to the Current Medication list given to you today.  If you need a refill on your cardiac medications before your next appointment, please call your pharmacy.   Lab work: None ordered If you have labs (blood work) drawn today and your tests are completely normal, you will receive your results only by: Marland Kitchen MyChart Message (if you have MyChart) OR . A paper copy in the mail If you have any lab test that is abnormal or we need to change your treatment, we will call you to review the results.  Testing/Procedures: None ordered  Follow-Up: At [facility], you and your health needs are our priority.  As part of our continuing mission to provide you with exceptional heart care, we have created designated Provider Care Teams.  These Care Teams include your primary Cardiologist (physician) and Advanced Practice Providers (APPs -  Physician Assistants and Nurse Practitioners) who all work together to provide you with the care you need, when you need it. You will need a follow up appointment in 6 months.  Please call our office 2 months in advance to schedule this appointment.  You may see  Dr.Acharya or one of the following Advanced Practice Providers on your designated Care Team:   Rosaria Ferries, PA-C . Jory Sims, DNP, ANP  Any Other Special Instructions Will Be Listed Below (If Applicable). Your physician discussed the hazards of tobacco use. Tobacco use cessation is recommended and techniques and options to help you quit were discussed.

## 2018-02-24 NOTE — Progress Notes (Signed)
Cardiology Office Note:    Date:  02/24/2018   ID:  Katelyn Bell, DOB 26-Feb-1955, MRN [id number]  PCP:  Patient, No Pcp Per  Cardiologist:  No primary care provider on file.  Electrophysiologist:  None   Referring MD: Katelyn Neer, MD   Follow-up elevated coronary calcium score, dyspnea on exertion  History of Present Illness:    Katelyn Bell is a 63 y.o. female with a hx of GERD, glaucoma, and rheumatoid arthritis who presents today for follow-up.  I had seen her last in October for mild dyspnea on exertion as well as exertional interscapular discomfort.  She has a strong family history of coronary artery disease detailed in my initial note.  For her dyspnea on exertion and coronary calcifications, we performed an exercise echo.  This was felt to be low risk with maximal exercise.  For hyperlipidemia and coronary artery calcifications we initiated high intensity statin therapy with rosuvastatin 20 mg daily.  She had a period of dizziness after starting rosuvastatin, however after visiting her rheumatologist and reinitiating her folic acid and B vitamin supplementation, the dizziness went away.  She is on methotrexate for rheumatoid arthritis.  She is continued on Crestor 20 mg daily without incident.  She recently had surgery on her foot for a Morton's neuroma, and is ready to get back to her walking program.  She had significant foot pain prior to her surgery which has limited her ability to be active.  She has been cleared by her foot specialist and is eager to get back to walking.  She has been asymptomatic since our last visit from a cardiopulmonary standpoint.  The patient denies chest pain, chest pressure, dyspnea at rest or with exertion, palpitations, PND, orthopnea, or leg swelling. Denies syncope or presyncope. Denies dizziness or lightheadedness. Denies snoring and has not be evaluated for sleep apnea.  Past Medical History:  Diagnosis Date  . Fluid retention   . GERD  (gastroesophageal reflux disease)   . Glaucoma   . Osteoarthritis   . Rheumatoid arthritis(714.0)     Past Surgical History:  Procedure Laterality Date  . CARPAL TUNNEL RELEASE  ~ 2 years   bilateral  . KNEE ARTHROSCOPY  07/17/11   right  . PARTIAL HYSTERECTOMY    . TONSILLECTOMY    . TOTAL KNEE ARTHROPLASTY  09/11/2011   Procedure: TOTAL KNEE ARTHROPLASTY;  Surgeon: Katelyn Salen, MD;  Location: [place];  Service: Orthopedics;  Laterality: Right;  Right total knee Arthroplasty    Current Medications: Current Meds  Medication Sig  . Adalimumab (HUMIRA) 40 MG/0.8ML PSKT Inject 1 application into the skin every 7 (seven) days.  . brimonidine-timolol (COMBIGAN) 0.2-0.5 % ophthalmic solution Place 1 drop into both eyes 2 (two) times daily.  Marland Kitchen buPROPion (WELLBUTRIN XL) 150 MG 24 hr tablet   . calcium carbonate (OS-CAL) 1250 MG chewable tablet Chew 1 tablet by mouth daily.  . Cetirizine HCl (ZYRTEC ALLERGY PO) Take by mouth.  . Cinnamon 500 MG capsule Take 2,000 mg by mouth daily.  . diazepam (VALIUM) 5 MG tablet Take 5 mg by mouth every 8 (eight) hours as needed for anxiety.   Marland Kitchen estradiol (ESTRACE) 1 MG tablet Take 1.5 mg by mouth daily.  . fish oil-omega-3 fatty acids 1000 MG capsule Take 1 g by mouth daily.  . folic acid (FOLVITE) 1 MG tablet Take 2 mg by mouth daily.  . methotrexate (RHEUMATREX) 2.5 MG tablet Take 25 mg by mouth once a week.  Caution:Chemotherapy. Protect from light.  . montelukast (SINGULAIR) 10 MG tablet Take 10 mg by mouth at bedtime.  . mupirocin ointment (BACTROBAN) 2 % Apply to wound twice a day.  Marland Kitchen omeprazole (PRILOSEC) 20 MG capsule Take 20 mg by mouth daily.  . rosuvastatin (CRESTOR) 20 MG tablet Take 1 tablet (20 mg total) by mouth daily.  . vitamin C (ASCORBIC ACID) 500 MG tablet Take 1,000 mg by mouth daily.  . vitamin E 200 UNIT capsule Take 200 Units by mouth daily.     Allergies:   Clindamycin/lincomycin and Penicillins   Social History    Socioeconomic History  . Marital status: Married    Spouse name: Not on file  . Number of children: Not on file  . Years of education: Not on file  . Highest education level: Not on file  Occupational History  . Not on file  Social Needs  . Financial resource strain: Not on file  . Food insecurity:    Worry: Not on file    Inability: Not on file  . Transportation needs:    Medical: Not on file    Non-medical: Not on file  Tobacco Use  . Smoking status: Current Every Day Smoker    Packs/day: 1.00    Years: 20.00    Pack years: 20.00  . Smokeless tobacco: Never Used  Substance and Sexual Activity  . Alcohol use: No    Alcohol/week: 0.0 standard drinks  . Drug use: No  . Sexual activity: Not on file  Lifestyle  . Physical activity:    Days per week: Not on file    Minutes per session: Not on file  . Stress: Not on file  Relationships  . Social connections:    Talks on phone: Not on file    Gets together: Not on file    Attends religious service: Not on file    Active member of club or organization: Not on file    Attends meetings of clubs or organizations: Not on file    Relationship status: Not on file  Other Topics Concern  . Not on file  Social History Narrative  . Not on file     Family History: The patient's family history includes Diabetes in her father.  ROS:   Please see the history of present illness.    All other systems reviewed and are negative.  EKGs/Labs/Other Studies Reviewed:    The following studies were reviewed today:  EKG: Not performed  Recent Labs: 05/05/2017: BUN 13; Creatinine, Ser 0.61; Hemoglobin 13.5; Platelets 241; Potassium 4.2; Sodium 138  Recent Lipid Panel No results found for: CHOL, TRIG, HDL, CHOLHDL, VLDL, LDLCALC, LDLDIRECT  Physical Exam:    VS:  BP 102/80   Pulse 73   Ht 5' (1.524 m)   Wt 134 lb 3.2 oz (60.9 kg)   SpO2 95%   BMI 26.21 kg/m     Wt Readings from Last 3 Encounters:  02/24/18 134 lb 3.2 oz  (60.9 kg)  11/19/17 132 lb (59.9 kg)  05/05/17 130 lb (59 kg)     Constitutional: No acute distress Eyes: pupils equally round and reactive to light, sclera non-icteric, normal conjunctiva and lids ENMT: normal dentition, moist mucous membranes Cardiovascular: regular rhythm, normal rate, no murmurs. S1 and S2 normal. Radial pulses normal bilaterally. No jugular venous distention.  Respiratory: clear to auscultation bilaterally GI : normal bowel sounds, soft and nontender. No distention.   MSK: extremities warm, well perfused. No edema.  NEURO: grossly nonfocal exam, moves all extremities. PSYCH: alert and oriented x 3, normal mood and affect.    ASSESSMENT:    1. 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 Tobacco abuse   3. Agatston coronary artery calcium score between 200 and 399    PLAN:     She is asymptomatic and has been doing well from a cardiac standpoint.  She is tolerating her Crestor well we will continue this medication.  No other changes to her medical therapy are required at this time.  We discussed hyperlipidemia and labs will be checked at her next primary care appointment for her annual visit.  I anticipate that the Crestor will have a positive impact on her cholesterol.  We discussed smoking cessation today and she has recommendations provided by her primary care physician.  We discussed the cardiac implications of continued smoking and she demonstrated understanding.  She continues to remain asymptomatic I would like to see her back in 6 months.  I have encouraged her to call the office to be seen sooner if she is having any concerning symptoms.  We discussed diet and lifestyle as well as exercise recommendations today.   Medication Adjustments/Labs and Tests Ordered: Current medicines are reviewed at length with the patient today.  Concerns regarding medicines are outlined above.   No orders of the defined types were placed in this encounter.  No orders of the defined types were placed in this encounter.   Patient Instructions  Medication Instructions:  Your physician recommends that you continue on your current medications as directed. Please refer to the Current Medication list given to you today.  If you need a refill on your cardiac medications before your next appointment, please call your pharmacy.   Lab work: None ordered If you have labs (blood work) drawn today and your tests are completely normal, you will receive your results only by: Marland Kitchen MyChart Message (if you have MyChart) OR . A paper copy in the mail If you have any lab test that is abnormal or we need to change your treatment, we will call you to review the results.  Testing/Procedures: None ordered  Follow-Up: At [facility], you and your health needs are our priority.  As part of our continuing mission to provide you with exceptional heart care, we have created designated Provider Care Teams.  These Care Teams include your primary Cardiologist (physician) and Advanced Practice Providers (APPs -  Physician Assistants and Nurse Practitioners) who all work together to provide you with the care you need, when you need it. You will need a follow up appointment in 6 months.  Please call our office 2 months in advance to schedule this appointment.  You may see  Dr.Antonino Nienhuis or one of the following Advanced Practice Providers on your designated Care Team:   Rosaria Ferries, PA-C . Jory Sims, DNP, ANP  Any Other Special Instructions Will Be Listed Below (If Applicable). Your physician discussed the hazards of tobacco use. Tobacco use cessation is recommended and techniques and options to help you quit were discussed.           Signed, Elouise Munroe, MD  02/24/2018 9:28 AM    [place]

## 2018-02-26 ENCOUNTER — Other Ambulatory Visit: Payer: 59

## 2018-02-26 DIAGNOSIS — H10413 Chronic giant papillary conjunctivitis, bilateral: Secondary | ICD-10-CM | POA: Diagnosis not present

## 2018-02-26 DIAGNOSIS — H2513 Age-related nuclear cataract, bilateral: Secondary | ICD-10-CM | POA: Diagnosis not present

## 2018-02-26 DIAGNOSIS — H401132 Primary open-angle glaucoma, bilateral, moderate stage: Secondary | ICD-10-CM | POA: Diagnosis not present

## 2018-03-20 DIAGNOSIS — E782 Mixed hyperlipidemia: Secondary | ICD-10-CM | POA: Diagnosis not present

## 2018-03-29 DIAGNOSIS — K130 Diseases of lips: Secondary | ICD-10-CM | POA: Diagnosis not present

## 2018-04-24 ENCOUNTER — Encounter (HOSPITAL_COMMUNITY): Payer: Self-pay | Admitting: Emergency Medicine

## 2018-04-24 ENCOUNTER — Emergency Department (HOSPITAL_COMMUNITY)
Admission: EM | Admit: 2018-04-24 | Discharge: 2018-04-24 | Disposition: A | Discharge status: Home / Self Care | Payer: 59 | Attending: Emergency Medicine | Admitting: Emergency Medicine

## 2018-04-24 ENCOUNTER — Emergency Department (HOSPITAL_COMMUNITY): Payer: 59

## 2018-04-24 ENCOUNTER — Other Ambulatory Visit: Payer: Self-pay

## 2018-04-24 DIAGNOSIS — R1032 Left lower quadrant pain: Secondary | ICD-10-CM | POA: Diagnosis present

## 2018-04-24 DIAGNOSIS — Z79899 Other long term (current) drug therapy: Secondary | ICD-10-CM | POA: Diagnosis not present

## 2018-04-24 DIAGNOSIS — F17210 Nicotine dependence, cigarettes, uncomplicated: Secondary | ICD-10-CM | POA: Insufficient documentation

## 2018-04-24 DIAGNOSIS — R1084 Generalized abdominal pain: Secondary | ICD-10-CM | POA: Diagnosis not present

## 2018-04-24 DIAGNOSIS — K5730 Diverticulosis of large intestine without perforation or abscess without bleeding: Secondary | ICD-10-CM | POA: Diagnosis not present

## 2018-04-24 LAB — URINALYSIS, ROUTINE W REFLEX MICROSCOPIC
Bilirubin Urine: NEGATIVE
Glucose, UA: NEGATIVE mg/dL
Hgb urine dipstick: NEGATIVE
Ketones, ur: NEGATIVE mg/dL
Leukocytes,Ua: NEGATIVE
Nitrite: NEGATIVE
Protein, ur: NEGATIVE mg/dL
Specific Gravity, Urine: 1.009 (ref 1.005–1.030)
pH: 6 (ref 5.0–8.0)

## 2018-04-24 LAB — COMPREHENSIVE METABOLIC PANEL
ALT: 30 U/L (ref 0–44)
AST: 32 U/L (ref 15–41)
Albumin: 4 g/dL (ref 3.5–5.0)
Alkaline Phosphatase: 62 U/L (ref 38–126)
Anion gap: 12 (ref 5–15)
BUN: 8 mg/dL (ref 8–23)
CO2: 19 mmol/L — ABNORMAL LOW (ref 22–32)
Calcium: 9.3 mg/dL (ref 8.9–10.3)
Chloride: 106 mmol/L (ref 98–111)
Creatinine, Ser: 0.65 mg/dL (ref 0.44–1.00)
GFR calc Af Amer: >60 mL/min (ref >60)
GFR calc non Af Amer: >60 mL/min (ref >60)
Glucose, Bld: 106 mg/dL — ABNORMAL HIGH (ref 70–99)
Potassium: 3.8 mmol/L (ref 3.5–5.1)
Sodium: 137 mmol/L (ref 135–145)
Total Bilirubin: 0.6 mg/dL (ref 0.3–1.2)
Total Protein: 7.2 g/dL (ref 6.5–8.1)

## 2018-04-24 LAB — POC OCCULT BLOOD, ED: Fecal Occult Bld: NEGATIVE

## 2018-04-24 LAB — CBC WITH DIFFERENTIAL/PLATELET
Abs Immature Granulocytes: 0.03 10*3/uL (ref 0.00–0.07)
Basophils Absolute: 0 10*3/uL (ref 0.0–0.1)
Basophils Relative: 1 %
Eosinophils Absolute: 0.3 10*3/uL (ref 0.0–0.5)
Eosinophils Relative: 3 %
HCT: 43.1 % (ref 36.0–46.0)
Hemoglobin: 14.5 g/dL (ref 12.0–15.0)
Immature Granulocytes: 0 %
Lymphocytes Relative: 33 %
Lymphs Abs: 2.6 10*3/uL (ref 0.7–4.0)
MCH: 34.7 pg — ABNORMAL HIGH (ref 26.0–34.0)
MCHC: 33.6 g/dL (ref 30.0–36.0)
MCV: 103.1 fL — ABNORMAL HIGH (ref 80.0–100.0)
Monocytes Absolute: 0.6 10*3/uL (ref 0.1–1.0)
Monocytes Relative: 8 %
Neutro Abs: 4.3 10*3/uL (ref 1.7–7.7)
Neutrophils Relative %: 55 %
Platelets: 256 10*3/uL (ref 150–400)
RBC: 4.18 MIL/uL (ref 3.87–5.11)
RDW: 13.8 % (ref 11.5–15.5)
WBC: 7.8 10*3/uL (ref 4.0–10.5)
nRBC: 0 % (ref 0.0–0.2)

## 2018-04-24 LAB — LIPASE, BLOOD: Lipase: 21 U/L (ref 11–51)

## 2018-04-24 MED ORDER — IOHEXOL 300 MG/ML  SOLN
100.0000 mL | Freq: Once | INTRAMUSCULAR | Status: AC | PRN
  Administered 2018-04-24: 100 mL via INTRAVENOUS

## 2018-04-24 MED ORDER — SODIUM CHLORIDE 0.9 % IV BOLUS
1000.0000 mL | Freq: Once | INTRAVENOUS | Status: AC
  Administered 2018-04-24: 1000 mL via INTRAVENOUS

## 2018-04-24 NOTE — ED Triage Notes (Signed)
Pt has pain to LLQ abdomen since Sunday. Went to PCP. Had urine collected, she states no UTI. Pt states she takes metamucil daily but has been going more than normal, stool was dark. Not on blood thinners.

## 2018-04-24 NOTE — ED Provider Notes (Signed)
[facility] EMERGENCY DEPARTMENT Provider Note   CSN: [id number] Arrival date & time: 04/24/18  1019    History   Chief Complaint Chief Complaint  Patient presents with   Abdominal Pain    HPI Katelyn Bell is a 63 y.o. female present today for 5-day history of left lower quadrant pain.  Patient reports that pain is a mild burning/bloated feeling constant without aggravating or alleviating factors.  Patient was seen in urgent care today where urinalysis was performed without signs of UTI and she was encouraged to present to the ER for work-up of diverticulitis.  Additionally patient reports that she has been taking Metamucil daily to help with the bloating and her stools have appeared darker than normal over the past few days.  She denies seeing blood in her stool or pain with bowel movements.  Patient denies any other concerns today.  She denies fever, nausea/vomiting, diarrhea, cough, chest pain/shortness of breath, dysuria/hematuria, flank pain, vaginal pain/discharge or radiation of pain from the left lower quadrant.     HPI  Past Medical History:  Diagnosis Date   Fluid retention    GERD (gastroesophageal reflux disease)    Glaucoma    Osteoarthritis    Rheumatoid arthritis(714.0)     Patient Active Problem List   Diagnosis Date Noted   Osteoarthritis of right knee 09/11/2011    Past Surgical History:  Procedure Laterality Date   CARPAL TUNNEL RELEASE  ~ 2 years   bilateral   KNEE ARTHROSCOPY  07/17/11   right   PARTIAL HYSTERECTOMY     TONSILLECTOMY     TOTAL KNEE ARTHROPLASTY  09/11/2011   Procedure: TOTAL KNEE ARTHROPLASTY;  Surgeon: Kerin Salen, MD;  Location: [facility];  Service: Orthopedics;  Laterality: Right;  Right total knee Arthroplasty     OB History   No obstetric history on file.      Home Medications    Prior to Admission medications   Medication Sig Start Date End Date Taking? Authorizing Provider    Adalimumab (HUMIRA) 40 MG/0.8ML PSKT Inject 1 application into the skin every 7 (seven) days.    [provider]  brimonidine-timolol (COMBIGAN) 0.2-0.5 % ophthalmic solution Place 1 drop into both eyes 2 (two) times daily.    [provider]  buPROPion (WELLBUTRIN XL) 150 MG 24 hr tablet  02/20/18   [provider]  calcium carbonate (OS-CAL) 1250 MG chewable tablet Chew 1 tablet by mouth daily.    [provider]  Cetirizine HCl (ZYRTEC ALLERGY PO) Take by mouth.    [provider]  Cinnamon 500 MG capsule Take 2,000 mg by mouth daily.    [provider]  diazepam (VALIUM) 5 MG tablet Take 5 mg by mouth every 8 (eight) hours as needed for anxiety.  10/22/13   [provider]  estradiol (ESTRACE) 1 MG tablet Take 1.5 mg by mouth daily.    [provider]  fish oil-omega-3 fatty acids 1000 MG capsule Take 1 g by mouth daily.    [provider]  folic acid (FOLVITE) 1 MG tablet Take 2 mg by mouth daily.    [provider]  methotrexate (RHEUMATREX) 2.5 MG tablet Take 25 mg by mouth once a week. Caution:Chemotherapy. Protect from light.    [provider]  montelukast (SINGULAIR) 10 MG tablet Take 10 mg by mouth at bedtime.    [provider]  mupirocin ointment (BACTROBAN) 2 % Apply to wound twice a  day. 04/18/15   Seddon, Max T, DPM  omeprazole (PRILOSEC) 20 MG capsule Take 20 mg by mouth daily.    [provider]  ondansetron (ZOFRAN) 4 MG tablet Take 1 tablet (4 mg total) by mouth every 8 (eight) hours as needed for nausea or vomiting. Patient not taking: Reported on 02/24/2018 01/14/18   Tyson Dense T, DPM  rosuvastatin (CRESTOR) 20 MG tablet Take 1 tablet (20 mg total) by mouth daily. 11/19/17 02/24/18  Elouise Munroe, MD  vitamin C (ASCORBIC ACID) 500 MG tablet Take 1,000 mg by mouth daily.    [provider]  vitamin E 200 UNIT capsule Take 200 Units by mouth daily.     [provider]    Family History Family History  Problem Relation Age of Onset   Diabetes Father     Social History Social History   Tobacco Use   Smoking status: Current Every Day Smoker    Packs/day: 1.00    Years: 20.00    Pack years: 20.00   Smokeless tobacco: Never Used  Substance Use Topics   Alcohol use: No    Alcohol/week: 0.0 standard drinks   Drug use: No     Allergies   Clindamycin/lincomycin and Penicillins   Review of Systems Review of Systems  Constitutional: Negative.  Negative for chills and fever.  Respiratory: Negative.  Negative for cough and shortness of breath.   Cardiovascular: Negative.  Negative for chest pain.  Gastrointestinal: Positive for abdominal pain. Negative for blood in stool, diarrhea, nausea and vomiting.       + Dark-colored stools  Genitourinary: Negative.  Negative for dysuria, flank pain, hematuria, vaginal bleeding and vaginal discharge.  Musculoskeletal: Negative.  Negative for arthralgias and myalgias.  Neurological: Negative.  Negative for weakness and headaches.  All other systems reviewed and are negative.  Physical Exam Updated Vital Signs BP 136/79 (BP Location: Right Arm)    Pulse 62    Temp 97.6 F (36.4 C) (Oral)    Resp 16    Ht 5' (1.524 m)    Wt 60.8 kg    SpO2 100%    BMI 26.17 kg/m   Physical Exam Constitutional:      General: She is not in acute distress.    Appearance: Normal appearance. She is well-developed. She is not ill-appearing or diaphoretic.  HENT:     Head: Normocephalic and atraumatic.     Right Ear: External ear normal.     Left Ear: External ear normal.     Nose: Nose normal.  Eyes:     General: Vision grossly intact. Gaze aligned appropriately.     Pupils: Pupils are equal, round, and reactive to light.  Neck:     Musculoskeletal: Normal range of motion.     Trachea: Trachea and phonation normal. No tracheal deviation.  Cardiovascular:     Rate and Rhythm: Normal rate  and regular rhythm.     Pulses:          Dorsalis pedis pulses are 2+ on the right side and 2+ on the left side.       Posterior tibial pulses are 2+ on the right side and 2+ on the left side.  Pulmonary:     Effort: Pulmonary effort is normal. No respiratory distress.     Breath sounds: Normal breath sounds.  Abdominal:     General: There is no distension.     Palpations: Abdomen is soft.     Tenderness: There  is abdominal tenderness in the left lower quadrant. There is no right CVA tenderness, left CVA tenderness, guarding or rebound. Negative signs include Murphy's sign and McBurney's sign.    Genitourinary:    Comments: Rectal Examination chaperoned by Alexa RN.  Normal tone, no external or internal hemorrhoids present, no palpable fissures, no gross blood on examination. - Pelvic Exam Deferred by patient. Musculoskeletal: Normal range of motion.     Right lower leg: Normal. She exhibits no tenderness. No edema.     Left lower leg: Normal. She exhibits no tenderness. No edema.  Feet:     Right foot:     Protective Sensation: 3 sites tested. 3 sites sensed.     Left foot:     Protective Sensation: 3 sites tested. 3 sites sensed.  Skin:    General: Skin is warm and dry.  Neurological:     Mental Status: She is alert.     GCS: GCS eye subscore is 4. GCS verbal subscore is 5. GCS motor subscore is 6.     Comments: Speech is clear and goal oriented, follows commands Major Cranial nerves without deficit, no facial droop Moves extremities without ataxia, coordination intact Normal Gait  Psychiatric:        Behavior: Behavior normal.    ED Treatments / Results  Labs (all labs ordered are listed, but only abnormal results are displayed) Labs Reviewed  CBC WITH DIFFERENTIAL/PLATELET - Abnormal; Notable for the following components:      Result Value   MCV 103.1 (*)    MCH 34.7 (*)    All other components within normal limits  COMPREHENSIVE METABOLIC PANEL - Abnormal;  Notable for the following components:   CO2 19 (*)    Glucose, Bld 106 (*)    All other components within normal limits  URINALYSIS, ROUTINE W REFLEX MICROSCOPIC - Abnormal; Notable for the following components:   APPearance HAZY (*)    All other components within normal limits  LIPASE, BLOOD  POC OCCULT BLOOD, ED    EKG None  Radiology Ct Abdomen Pelvis W Contrast  Result Date: 04/24/2018 CLINICAL DATA:  Pt has pain to LLQ abdomen since Sunday. Went to PCP. Had urine collected, she states no UTI. Pt states she takes metamucil daily but has been going more than normal, stool was dark. Not on blood thinners EXAM: CT ABDOMEN AND PELVIS WITH CONTRAST TECHNIQUE: Multidetector CT imaging of the abdomen and pelvis was performed using the standard protocol following bolus administration of intravenous contrast. CONTRAST:  17mL OMNIPAQUE IOHEXOL 300 MG/ML  SOLN COMPARISON:  None. FINDINGS: Lower chest: No acute abnormality. Hepatobiliary: No focal liver abnormality is seen. No gallstones, gallbladder wall thickening, or biliary dilatation. Pancreas: Unremarkable. No pancreatic ductal dilatation or surrounding inflammatory changes. Spleen: Normal in size without focal abnormality. Adrenals/Urinary Tract: Adrenal glands are unremarkable. Kidneys are normal, without renal calculi, focal lesion, or hydronephrosis. Bladder is unremarkable. Stomach/Bowel: Stomach is within normal limits. No evidence of bowel wall thickening, distention, or inflammatory changes. Sigmoid diverticulosis without evidence of diverticulitis. Vascular/Lymphatic: Normal caliber abdominal aorta with atherosclerosis. No lymphadenopathy. Reproductive: Uterus is normal.  3.6 x 2 cm right ovarian cyst. Other: No abdominal wall hernia or abnormality. No abdominopelvic ascites. Musculoskeletal: No acute osseous abnormality. No aggressive osseous lesion. Mild osteoarthritis of bilateral sacroiliac joints. IMPRESSION: 1. Sigmoid diverticulosis  without evidence of diverticulitis. 2.  Aortic Atherosclerosis (ICD10-I70.0). Electronically Signed   By: Kathreen Devoid   On: 04/24/2018 12:32    Procedures  Procedures (including critical care time)  Medications Ordered in ED Medications  sodium chloride 0.9 % bolus 1,000 mL (0 mLs Intravenous Stopped 04/24/18 1257)  iohexol (OMNIPAQUE) 300 MG/ML solution 100 mL (100 mLs Intravenous Contrast Given 04/24/18 1206)     Initial Impression / Assessment and Plan / ED Course  I have reviewed the triage vital signs and the nursing notes.  Pertinent labs & imaging results that were available during my care of the patient were reviewed by me and considered in my medical decision making (see chart for details).    63 year old female presents with left lower quadrant pain x5 days.  Also with darker stools for the past 2 days. Minimal tenderness on examination and no peritoneal signs.  Fluid bolus given CBC unremarkable CMP unremarkable Urinalysis unremarkable Lipase within normal limits Fecal occult negative CT abdomen pelvis: IMPRESSION: 1. Sigmoid diverticulosis without evidence of diverticulitis. 2.  Aortic Atherosclerosis ------------------ On reassessment patient is fully dressed and is requesting discharge.  She is been updated on work-up today including findings of diverticulosis and atherosclerosis.  She has been encouraged to follow-up with her primary care provider regarding this.  Vital signs have remained stable.  Reevaluation of the abdomen reveals a nontender nondistended abdomen without peritoneal signs.  Patient does not meet SIRS or sepsis criteria and does not have a surgical abdomen.  No indication of appendicitis, bowel obstruction, bowel perforation, cholecystitis, diverticulitis today.  Patient has refused pelvic examination today but denies any vaginal symptoms and with history of hysterectomy I have low suspicion for PID/ectopic pregnancy and feel it is reasonable for patient to  defer this today.  Patient has been encouraged to drink plenty water and get plenty rest over the next few days and follow-up with primary care provider.  At this time there does not appear to be any evidence of an acute emergency medical condition and the patient appears stable for discharge with appropriate outpatient follow up. Diagnosis was discussed with patient who verbalizes understanding of care plan and is agreeable to discharge. I have discussed return precautions with patient who verbalizes understanding of return precautions. Patient encouraged to follow-up with their PCP. All questions answered. Patient has been discharged in good condition.  Patient's case discussed with Dr. Ronnald Nian who agrees with plan to discharge with primary care follow-up.   Note: Portions of this report may have been transcribed using voice recognition software. Every effort was made to ensure accuracy; however, inadvertent computerized transcription errors may still be present. Final Clinical Impressions(s) / ED Diagnoses   Final diagnoses:  Left lower quadrant abdominal pain    ED Discharge Orders    None       Gari Crown 04/24/18 1413    Lennice Sites, DO 04/24/18 1726

## 2018-04-24 NOTE — ED Notes (Signed)
Patient verbalizes understanding of discharge instructions. Opportunity for questioning and answering were provided. Armband removed by staff , patient discharged from ED. 

## 2018-04-24 NOTE — ED Notes (Signed)
Alexus(NT) collected a urine culture and sent down to main lab.

## 2018-04-24 NOTE — ED Notes (Signed)
Ambulated Pt to restroom to collect urinalysis, NT Danity Schmelzer

## 2018-04-24 NOTE — Discharge Instructions (Signed)
You have been diagnosed today with Abdominal Pain of the left lower quadrant.  At this time there does not appear to be the presence of an emergent medical condition, however there is always the potential for conditions to change. Please read and follow the below instructions.  Please return to the Emergency Department immediately for any new or worsening symptoms. Please be sure to follow up with your Primary Care Provider within one week regarding your visit today; please call their office to schedule an appointment even if you are feeling better for a follow-up visit. Please drink plenty of water and get plenty of rest. Your CT scan today showed Diverticulosis and Arthrosclerosis; please follow-up with your primary care this week to discuss these findings.  Get help right away if: Your pain gets worse. Your bloating becomes very bad. You have a fever or chills, and your symptoms suddenly get worse. You vomit. You have bowel movements that are bloody or black. You have bleeding from your rectum. Get help right away if: Your pain does not go away as soon as your doctor says it should. You cannot stop throwing up. You have chest pain or shortness of breath. Your pain is only in areas of your belly, such as the right side or the left lower part of the belly. You have bloody or black poop, or poop that looks like tar. You have very bad pain, cramping, or bloating in your belly. You have signs of not having enough fluid or water in your body (dehydration), such as: Dark pee, very little pee, or no pee. Cracked lips. Dry mouth. Sunken eyes. Sleepiness. Weakness.  Please read the additional information packets attached to your discharge summary.

## 2018-05-07 DIAGNOSIS — Z79899 Other long term (current) drug therapy: Secondary | ICD-10-CM | POA: Diagnosis not present

## 2018-05-07 DIAGNOSIS — M0600 Rheumatoid arthritis without rheumatoid factor, unspecified site: Secondary | ICD-10-CM | POA: Diagnosis not present

## 2018-05-07 DIAGNOSIS — L4050 Arthropathic psoriasis, unspecified: Secondary | ICD-10-CM | POA: Diagnosis not present

## 2018-06-16 DIAGNOSIS — L4050 Arthropathic psoriasis, unspecified: Secondary | ICD-10-CM | POA: Diagnosis not present

## 2018-06-16 DIAGNOSIS — Z79899 Other long term (current) drug therapy: Secondary | ICD-10-CM | POA: Diagnosis not present

## 2018-08-26 ENCOUNTER — Telehealth: Payer: Self-pay | Admitting: *Deleted

## 2018-08-26 NOTE — Telephone Encounter (Signed)

## 2018-09-03 ENCOUNTER — Telehealth (INDEPENDENT_AMBULATORY_CARE_PROVIDER_SITE_OTHER): Payer: 59 | Admitting: Internal Medicine

## 2018-09-03 ENCOUNTER — Telehealth: Payer: Self-pay | Admitting: *Deleted

## 2018-09-03 ENCOUNTER — Encounter: Payer: Self-pay | Admitting: Internal Medicine

## 2018-09-03 ENCOUNTER — Other Ambulatory Visit: Payer: Self-pay

## 2018-09-03 VITALS — BP 122/69 | HR 66 | Temp 96.3°F | Ht 60.0 in | Wt 130.0 lb

## 2018-09-03 DIAGNOSIS — R931 Abnormal findings on diagnostic imaging of heart and coronary circulation: Secondary | ICD-10-CM

## 2018-09-03 DIAGNOSIS — E785 Hyperlipidemia, unspecified: Secondary | ICD-10-CM

## 2018-09-03 DIAGNOSIS — I25118 Atherosclerotic heart disease of native coronary artery with other forms of angina pectoris: Secondary | ICD-10-CM

## 2018-09-03 DIAGNOSIS — Z720 Tobacco use: Secondary | ICD-10-CM | POA: Diagnosis not present

## 2018-09-03 NOTE — Progress Notes (Signed)
Virtual Visit via Video Note   This visit type was conducted due to national recommendations for restrictions regarding the COVID-19 Pandemic (e.g. social distancing) in an effort to limit this patient's exposure and mitigate transmission in our community.  Due to her co-morbid illnesses, this patient is at least at moderate risk for complications without adequate follow up.  This format is felt to be most appropriate for this patient at this time.  All issues noted in this document were discussed and addressed.  A limited physical exam was performed with this format.  Please refer to the patient's chart for her consent to telehealth for [facility].   Date:  09/03/2018   ID:  Katelyn Bell, DOB 02/18/1955, MRN [id number]  Patient Location: Home Provider Location: Home  PCP:  Katelyn Neer, MD  Cardiologist:  No primary care provider on file.  Electrophysiologist:  None   Evaluation Performed:  Follow-Up Visit  Chief Complaint:  F/u elevated CAC score, DOE  History of Present Illness:    Katelyn Bell is a 63 y.o. female with hx of GERD, glaucoma, and rheumatoid arthritis who presents today for follow-up.  I had seen her last in January to follow up mild dyspnea on exertion as well as exertional interscapular discomfort.  She has a strong family history of coronary artery disease detailed in my initial note.  For her dyspnea on exertion and coronary calcifications, we performed an exercise echo.  This was felt to be low risk with maximal exercise.  For hyperlipidemia and coronary artery calcifications we initiated high intensity statin therapy with rosuvastatin 20 mg daily.  She had a period of dizziness after starting rosuvastatin, however after visiting her rheumatologist and reinitiating her folic acid and B vitamin supplementation, the dizziness went away.  She is on methotrexate for rheumatoid arthritis.  She is continued on Crestor 20 mg daily without incident.  She has  been using CBD oil for energy, and feels that this helps her. I have asked her to review this with her rheumatologist given use of methotrexate.  The patient denies chest pain, chest pressure, dyspnea at rest or with exertion, palpitations, PND, orthopnea, or leg swelling. Denies syncope or presyncope. Denies dizziness or lightheadedness.  She continues to smoke and is finding it challenging to quit.  The patient does not have symptoms concerning for COVID-19 infection (fever, chills, cough, or new shortness of breath).    Past Medical History:  Diagnosis Date  . Fluid retention   . GERD (gastroesophageal reflux disease)   . Glaucoma   . Osteoarthritis   . Rheumatoid arthritis(714.0)    Past Surgical History:  Procedure Laterality Date  . CARPAL TUNNEL RELEASE  ~ 2 years   bilateral  . KNEE ARTHROSCOPY  07/17/11   right  . PARTIAL HYSTERECTOMY    . TONSILLECTOMY    . TOTAL KNEE ARTHROPLASTY  09/11/2011   Procedure: TOTAL KNEE ARTHROPLASTY;  Surgeon: Kerin Salen, MD;  Location: [facility];  Service: Orthopedics;  Laterality: Right;  Right total knee Arthroplasty     Current Meds  Medication Sig  . Adalimumab (HUMIRA) 40 MG/0.8ML PSKT Inject 1 application into the skin every 14 (fourteen) days.   . brimonidine-timolol (COMBIGAN) 0.2-0.5 % ophthalmic solution Place 1 drop into both eyes 2 (two) times daily.  . calcium carbonate (OS-CAL) 1250 MG chewable tablet Chew 1 tablet by mouth daily.  . Cetirizine HCl (ZYRTEC ALLERGY PO) Take by mouth.  . diazepam (VALIUM) 5 MG tablet  Take 5 mg by mouth every 8 (eight) hours as needed for anxiety.   Marland Kitchen estradiol (ESTRACE) 1 MG tablet Take 1.5 mg by mouth daily.  . fish oil-omega-3 fatty acids 1000 MG capsule Take 1 g by mouth daily.  . folic acid (FOLVITE) 1 MG tablet Take 2 mg by mouth daily.  . methotrexate (RHEUMATREX) 2.5 MG tablet Take 2.5 mg by mouth once a week. Caution:Chemotherapy. Protect from light.   . montelukast (SINGULAIR) 10 MG  tablet Take 10 mg by mouth at bedtime.  . mupirocin ointment (BACTROBAN) 2 % Apply to wound twice a day.  Marland Kitchen omeprazole (PRILOSEC) 20 MG capsule Take 20 mg by mouth daily.  . rosuvastatin (CRESTOR) 20 MG tablet Take 1 tablet (20 mg total) by mouth daily.  . vitamin C (ASCORBIC ACID) 500 MG tablet Take 1,000 mg by mouth daily.  . vitamin E 200 UNIT capsule Take 200 Units by mouth daily.     Allergies:   Clindamycin/lincomycin and Penicillins   Social History   Tobacco Use  . Smoking status: Current Every Day Smoker    Packs/day: 1.00    Years: 20.00    Pack years: 20.00  . Smokeless tobacco: Never Used  Substance Use Topics  . Alcohol use: No    Alcohol/week: 0.0 standard drinks  . Drug use: No     Family Hx: The patient's family history includes Diabetes in her father.  ROS:   Please see the history of present illness.     All other systems reviewed and are negative.   Prior CV studies:   The following studies were reviewed today:    Labs/Other Tests and Data Reviewed:    EKG:  No ECG reviewed.  Recent Labs: 04/24/2018: ALT 30; BUN 8; Creatinine, Ser 0.65; Hemoglobin 14.5; Platelets 256; Potassium 3.8; Sodium 137   Recent Lipid Panel No results found for: CHOL, TRIG, HDL, CHOLHDL, LDLCALC, LDLDIRECT  Wt Readings from Last 3 Encounters:  09/03/18 130 lb (59 kg)  04/24/18 134 lb (60.8 kg)  02/24/18 134 lb 3.2 oz (60.9 kg)     Objective:    Vital Signs:  BP 122/69   Pulse 66   Temp (!) 96.3 F (35.7 C)   Ht 5' (1.524 m)   Wt 130 lb (59 kg)   BMI 25.39 kg/m    VITAL SIGNS:  reviewed GEN:  no acute distress EYES:  sclerae anicteric, EOMI - Extraocular Movements Intact RESPIRATORY:  normal respiratory effort, symmetric expansion CARDIOVASCULAR:  no peripheral edema SKIN:  no rash, lesions or ulcers. MUSCULOSKELETAL:  no obvious deformities. NEURO:  alert and oriented x 3, no obvious focal deficit PSYCH:  normal affect  ASSESSMENT & PLAN:    1.  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 Agatston coronary artery calcium score between 200 and 399   3. Tobacco abuse   4. Hyperlipidemia, unspecified hyperlipidemia type    CAD - asymptomatic, no ischemia on stress. Continue rosuvastatin.   HLD - continue rosuvastatin  Tobacco abuse - encouraged quitting.   CV risk reduction - provided diet and lifestyle modification recommendations as noted below.  Exercise recommendations: Goal of exercising for at least 30 minutes a day, at least 5 times per week.  Please exercise to a moderate exertion.  This means that while exercising it is difficult to speak in full sentences, however you are not so short of breath that you  feel you must stop, and not so comfortable that you can carry on a full conversation.  Exertion level should be approximately a 5/10, if 10 is the most exertion you can perform.  Diet recommendations: Recommend a heart healthy diet such as the Mediterranean diet.  This diet consists of plant based foods, healthy fats, lean meats, olive oil.  It suggests limiting the intake of simple carbohydrates such as white breads, pastries, and pastas.  It also limits the amount of red meat, wine, and dairy products such as cheese that one should consume on a daily basis.   COVID-19 Education: The signs and symptoms of COVID-19 were discussed with the patient and how to seek care for testing (follow up with PCP or arrange E-visit).  The importance of social distancing was discussed today.  Time:   Today, I have spent 25 minutes with the patient with telehealth technology discussing the above problems.     Medication Adjustments/Labs and Tests Ordered: Current medicines are reviewed at length with the patient today.  Concerns regarding medicines are outlined above.   Tests Ordered: No orders of the defined types were placed in this encounter.    Medication Changes: No orders of the defined types were placed in this encounter.   Follow Up: 6 mo  Signed, Elouise Munroe, MD  09/03/2018 7:23 PM    [facility]

## 2018-09-03 NOTE — Telephone Encounter (Signed)
SPOKE TO PATIENT. INSTRUCTION WAS GIVEN FROM TODAY'S VIRTUAL VISIT.  AVS SUMMARY IS MAILED. patient verbalizED UNDERSTANDING.

## 2018-09-03 NOTE — Patient Instructions (Addendum)
Medication Instructions:   NO CHANGE IN MEDICATIONS  If you need a refill on your cardiac medications before your next appointment, please call your pharmacy.   Lab work:  NOT NEEDED   Testing/Procedures:  NOT NEEDED  Follow-Up: At [facility], you and your health needs are our priority.  As part of our continuing mission to provide you with exceptional heart care, we have created designated Provider Care Teams.  These Care Teams include your primary Cardiologist (physician) and Advanced Practice Providers (APPs -  Physician Assistants and Nurse Practitioners) who all work together to provide you with the care you need, when you need it. . You will need a follow up appointment in   6 months- FEB 2021.  Please call our office 2 months in advance to schedule this appointment.  You may see Elouise Munroe, MD*or one of the following Advanced Practice Providers on your designated Care Team:   . Rosaria Ferries, PA-C . Jory Sims, DNP, ANP  Any Other Special Instructions Will Be Listed Below (If Applicable). Encouraged quitting smoking.     Coping with Quitting Smoking  Quitting smoking is a physical and mental challenge. You will face cravings, withdrawal symptoms, and temptation. Before quitting, work with your health care provider to make a plan that can help you cope. Preparation can help you quit and keep you from giving in. How can I cope with cravings? Cravings usually last for 5-10 minutes. If you get through it, the craving will pass. Consider taking the following actions to help you cope with cravings:  Keep your mouth busy: ? Chew sugar-free gum. ? Suck on hard candies or a straw. ? Brush your teeth.  Keep your hands and body busy: ? Immediately change to a different activity when you feel a craving. ? Squeeze or play with a ball. ? Do an activity or a hobby, like making bead jewelry, practicing needlepoint, or working with wood. ? Mix up your normal  routine. ? Take a short exercise break. Go for a quick walk or run up and down stairs. ? Spend time in public places where smoking is not allowed.  Focus on doing something kind or helpful for someone else.  Call a friend or family member to talk during a craving.  Join a support group.  Call a quit line, such as 1-800-QUIT-NOW.  Talk with your health care provider about medicines that might help you cope with cravings and make quitting easier for you. How can I deal with withdrawal symptoms? Your body may experience negative effects as it tries to get used to not having nicotine in the system. These effects are called withdrawal symptoms. They may include:  Feeling hungrier than normal.  Trouble concentrating.  Irritability.  Trouble sleeping.  Feeling depressed.  Restlessness and agitation.  Craving a cigarette. To manage withdrawal symptoms:  Avoid places, people, and activities that trigger your cravings.  Remember why you want to quit.  Get plenty of sleep.  Avoid coffee and other caffeinated drinks. These may worsen some of your symptoms. How can I handle social situations? Social situations can be difficult when you are quitting smoking, especially in the first few weeks. To manage this, you can:  Avoid parties, bars, and other social situations where people might be smoking.  Avoid alcohol.  Leave right away if you have the urge to smoke.  Explain to your family and friends that you are quitting smoking. Ask for understanding and support.  Plan activities with friends  or family where smoking is not an option. What are some ways I can cope with stress? Wanting to smoke may cause stress, and stress can make you want to smoke. Find ways to manage your stress. Relaxation techniques can help. For example:  Breathe slowly and deeply, in through your nose and out through your mouth.  Listen to soothing, relaxing music.  Talk with a family member or friend  about your stress.  Light a candle.  Soak in a bath or take a shower.  Think about a peaceful place. What are some ways I can prevent weight gain? Be aware that many people gain weight after they quit smoking. However, not everyone does. To keep from gaining weight, have a plan in place before you quit and stick to the plan after you quit. Your plan should include:  Having healthy snacks. When you have a craving, it may help to: ? Eat plain popcorn, crunchy carrots, celery, or other cut vegetables. ? Chew sugar-free gum.  Changing how you eat: ? Eat small portion sizes at meals. ? Eat 4-6 small meals throughout the day instead of 1-2 large meals a day. ? Be mindful when you eat. Do not watch television or do other things that might distract you as you eat.  Exercising regularly: ? Make time to exercise each day. If you do not have time for a long workout, do short bouts of exercise for 5-10 minutes several times a day. ? Do some form of strengthening exercise, like weight lifting, and some form of aerobic exercise, like running or swimming.  Drinking plenty of water or other low-calorie or no-calorie drinks. Drink 6-8 glasses of water daily, or as much as instructed by your health care provider. Summary  Quitting smoking is a physical and mental challenge. You will face cravings, withdrawal symptoms, and temptation to smoke again. Preparation can help you as you go through these challenges.  You can cope with cravings by keeping your mouth busy (such as by chewing gum), keeping your body and hands busy, and making calls to family, friends, or a helpline for people who want to quit smoking.  You can cope with withdrawal symptoms by avoiding places where people smoke, avoiding drinks with caffeine, and getting plenty of rest.  Ask your health care provider about the different ways to prevent weight gain, avoid stress, and handle social situations. This information is not intended to  replace advice given to you by your health care provider. Make sure you discuss any questions you have with your health care provider. Document Released: 01/12/2016 Document Revised: 12/27/2016 Document Reviewed: 01/12/2016 Elsevier Patient Education  2020 [employer].

## 2018-09-24 ENCOUNTER — Other Ambulatory Visit: Payer: Self-pay | Admitting: Family Medicine

## 2018-09-24 DIAGNOSIS — R911 Solitary pulmonary nodule: Secondary | ICD-10-CM

## 2018-10-02 ENCOUNTER — Ambulatory Visit
Admission: RE | Admit: 2018-10-02 | Discharge: 2018-10-02 | Disposition: A | Discharge status: Home / Self Care | Payer: 59 | Source: Ambulatory Visit | Attending: Family Medicine | Admitting: Family Medicine

## 2018-10-02 DIAGNOSIS — R911 Solitary pulmonary nodule: Secondary | ICD-10-CM

## 2018-10-07 ENCOUNTER — Other Ambulatory Visit: Payer: Self-pay | Admitting: Family Medicine

## 2018-10-07 DIAGNOSIS — E041 Nontoxic single thyroid nodule: Secondary | ICD-10-CM

## 2018-10-13 ENCOUNTER — Ambulatory Visit
Admission: RE | Admit: 2018-10-13 | Discharge: 2018-10-13 | Disposition: A | Discharge status: Home / Self Care | Payer: 59 | Source: Ambulatory Visit | Attending: Family Medicine | Admitting: Family Medicine

## 2018-10-13 DIAGNOSIS — E041 Nontoxic single thyroid nodule: Secondary | ICD-10-CM

## 2018-10-16 ENCOUNTER — Other Ambulatory Visit: Payer: Self-pay | Admitting: Family Medicine

## 2018-10-19 ENCOUNTER — Other Ambulatory Visit: Payer: Self-pay | Admitting: Family Medicine

## 2018-10-19 DIAGNOSIS — E041 Nontoxic single thyroid nodule: Secondary | ICD-10-CM

## 2018-11-12 ENCOUNTER — Ambulatory Visit
Admission: RE | Admit: 2018-11-12 | Discharge: 2018-11-12 | Disposition: A | Discharge status: Home / Self Care | Payer: BC Managed Care – PPO | Source: Ambulatory Visit | Attending: Family Medicine | Admitting: Family Medicine

## 2018-11-12 ENCOUNTER — Other Ambulatory Visit (HOSPITAL_COMMUNITY)
Admission: RE | Admit: 2018-11-12 | Discharge: 2018-11-12 | Disposition: A | Discharge status: Home / Self Care | Payer: BC Managed Care – PPO | Source: Ambulatory Visit | Attending: Physician Assistant | Admitting: Physician Assistant

## 2018-11-12 DIAGNOSIS — E041 Nontoxic single thyroid nodule: Secondary | ICD-10-CM | POA: Insufficient documentation

## 2018-11-12 NOTE — Procedures (Signed)
PROCEDURE SUMMARY:  Using direct ultrasound guidance, 5 passes were made using 25 g needles into the nodule within the left lobe of the thyroid.   Ultrasound was used to confirm needle placements on all occasions.   EBL = trace  Specimens were sent to Pathology for analysis.  See procedure note under Imaging tab in Epic for full procedure details.  WENDY S BLAIR PA-C 11/12/2018 3:41 PM

## 2018-11-13 LAB — CYTOLOGY - NON PAP

## 2018-12-02 ENCOUNTER — Encounter (HOSPITAL_COMMUNITY): Payer: Self-pay

## 2018-12-07 ENCOUNTER — Other Ambulatory Visit: Payer: Self-pay

## 2018-12-07 MED ORDER — ROSUVASTATIN CALCIUM 20 MG PO TABS: 20.0000 mg | ORAL_TABLET | Freq: Every day | ORAL | 3 refills | Status: DC

## 2019-03-09 ENCOUNTER — Telehealth: Payer: Self-pay | Admitting: *Deleted

## 2019-03-09 NOTE — Telephone Encounter (Signed)
A message was left, re: her follow up visit. 

## 2019-04-12 ENCOUNTER — Ambulatory Visit (INDEPENDENT_AMBULATORY_CARE_PROVIDER_SITE_OTHER): Payer: BC Managed Care – PPO | Admitting: Internal Medicine

## 2019-04-12 ENCOUNTER — Other Ambulatory Visit: Payer: Self-pay

## 2019-04-12 VITALS — BP 110/80 | HR 60 | Temp 97.1°F | Ht 60.0 in | Wt 139.6 lb

## 2019-04-12 DIAGNOSIS — R0600 Dyspnea, unspecified: Secondary | ICD-10-CM

## 2019-04-12 DIAGNOSIS — R0609 Other forms of dyspnea: Secondary | ICD-10-CM

## 2019-04-12 DIAGNOSIS — I25118 Atherosclerotic heart disease of native coronary artery with other forms of angina pectoris: Secondary | ICD-10-CM

## 2019-04-12 NOTE — Patient Instructions (Signed)
Medication Instructions:  No changes *If you need a refill on your cardiac medications before your next appointment, please call your pharmacy*   Lab Work: Not needed   Testing/Procedures: Not needed   Follow-Up: At [facility], you and your health needs are our priority.  As part of our continuing mission to provide you with exceptional heart care, we have created designated Provider Care Teams.  These Care Teams include your primary Cardiologist (physician) and Advanced Practice Providers (APPs -  Physician Assistants and Nurse Practitioners) who all work together to provide you with the care you need, when you need it.  We recommend signing up for the patient portal called "MyChart".  Sign up information is provided on this After Visit Summary.  MyChart is used to connect with patients for Virtual Visits (Telemedicine).  Patients are able to view lab/test results, encounter notes, upcoming appointments, etc.  Non-urgent messages can be sent to your provider as well.   To learn more about what you can do with MyChart, go to [email or web address].    Your next appointment:   6 month(s)  The format for your next appointment:   In Person  Provider:   Cherlynn Kaiser, MD

## 2019-04-12 NOTE — Progress Notes (Signed)
Cardiology Office Note:    Date:  04/12/2019   ID:  Katelyn Bell, DOB [birth year], MRN [id number]  PCP:  Mayra Neer, MD  Cardiologist:  Elouise Munroe, MD  Electrophysiologist:  None   Referring MD: Mayra Neer, MD   Chief Complaint: F/u elevated CAC score, DOE  History of Present Illness:    Katelyn Bell is a 64 y.o. female with a history of GERD, glaucoma, and rheumatoid arthritis who presents today for follow-up. I had seen her last in January to follow up mild dyspnea on exertion as well as exertional interscapular discomfort. She has a strong family history of coronary artery disease detailed in my initial note. For her dyspnea on exertion and coronary calcifications, we performed an exercise echo. This was felt to be low risk with maximal exercise. For hyperlipidemia and coronary artery calcifications we initiated high intensity statin therapy with rosuvastatin 20 mg daily. She had a period of dizziness after starting rosuvastatin, however after visiting her rheumatologist and reinitiating her folic acid and B vitamin supplementation, the dizziness went away. She is on methotrexate for rheumatoid arthritis. She is continued on Crestor 20 mg daily without incident.  She lost her mother in December and is still understandably grieving from this. I've expressed my sincere condolences. She and her sister hope to take a trip later this year to celebrate her mother's life. Her mother was a patient in our practice. I've recommended she speak to Dr. Brigitte Pulse about grief management strategies, and we have discussed this today as well.   Past Medical History:  Diagnosis Date  . Fluid retention   . GERD (gastroesophageal reflux disease)   . Glaucoma   . Osteoarthritis   . Rheumatoid arthritis(714.0)     Past Surgical History:  Procedure Laterality Date  . CARPAL TUNNEL RELEASE  ~ 2 years   bilateral  . KNEE ARTHROSCOPY  07/17/11   right  . PARTIAL  HYSTERECTOMY    . TONSILLECTOMY    . TOTAL KNEE ARTHROPLASTY  09/11/2011   Procedure: TOTAL KNEE ARTHROPLASTY;  Surgeon: Kerin Salen, MD;  Location: [facility];  Service: Orthopedics;  Laterality: Right;  Right total knee Arthroplasty    Current Medications: No outpatient medications have been marked as taking for the 04/12/19 encounter (Office Visit) with Elouise Munroe, MD.   Meds reviewed, unable to pull into chart.  Allergies:   Clindamycin/lincomycin and Penicillins   Social History   Socioeconomic History  . Marital status: Married    Spouse name: Not on file  . Number of children: Not on file  . Years of education: Not on file  . Highest education level: Not on file  Occupational History  . Not on file  Tobacco Use  . Smoking status: Current Every Day Smoker    Packs/day: 1.00    Years: 20.00    Pack years: 20.00  . Smokeless tobacco: Never Used  Substance and Sexual Activity  . Alcohol use: No    Alcohol/week: 0.0 standard drinks  . Drug use: No  . Sexual activity: Not on file  Other Topics Concern  . Not on file  Social History Narrative  . Not on file   Social Determinants of Health   Financial Resource Strain:   . Difficulty of Paying Living Expenses:   Food Insecurity:   . Worried About Charity fundraiser in the Last Year:   . Arboriculturist in the Last Year:   Transportation Needs:   .  Lack of Transportation (Medical):   Marland Kitchen Lack of Transportation (Non-Medical):   Physical Activity:   . Days of Exercise per Week:   . Minutes of Exercise per Session:   Stress:   . Feeling of Stress :   Social Connections:   . Frequency of Communication with Friends and Family:   . Frequency of Social Gatherings with Friends and Family:   . Attends Religious Services:   . Active Member of Clubs or Organizations:   . Attends Archivist Meetings:   Marland Kitchen Marital Status:      Family History: The patient's family history includes Diabetes in her  father.  ROS:   Please see the history of present illness.    All other systems reviewed and are negative.  EKGs/Labs/Other Studies Reviewed:    The following studies were reviewed today:  EKG: NSR, low voltage  Recent Labs: 04/24/2018: ALT 30; BUN 8; Creatinine, Ser 0.65; Hemoglobin 14.5; Platelets 256; Potassium 3.8; Sodium 137  Recent Lipid Panel No results found for: CHOL, TRIG, HDL, CHOLHDL, VLDL, LDLCALC, LDLDIRECT  Physical Exam:    VS:  BP 110/80   Pulse 60   Temp (!) 97.1 F (36.2 C)   Ht 5' (1.524 m)   Wt 139 lb 9.6 oz (63.3 kg)   SpO2 97%   BMI 27.26 kg/m     Wt Readings from Last 5 Encounters:  04/12/19 139 lb 9.6 oz (63.3 kg)  09/03/18 130 lb (59 kg)  04/24/18 134 lb (60.8 kg)  02/24/18 134 lb 3.2 oz (60.9 kg)  11/19/17 132 lb (59.9 kg)     Constitutional: No acute distress Eyes: sclera non-icteric, normal conjunctiva and lids ENMT: normal dentition, moist mucous membranes Cardiovascular: regular rhythm, normal rate, no murmurs. S1 and S2 normal. Radial pulses normal bilaterally. No jugular venous distention.  Respiratory: clear to auscultation bilaterally GI : normal bowel sounds, soft and nontender. No distention.   MSK: extremities warm, well perfused. No edema.  NEURO: grossly nonfocal exam, moves all extremities. PSYCH: alert and oriented x 3, normal mood and affect.      ASSESSMENT:    1. 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 DOE (dyspnea on exertion)    PLAN:    1. 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 DOE (dyspnea on exertion)  Nonischemic stress test. Continue to monitor. She is grieving the loss of her mother, we discussed grief management and regular exercise to help with symptoms.  - continue rosuvastatin.  HLD - continue rosuvastatin  Tobacco abuse -  encouraged cessation.  CV risk reduction - provided diet and lifestyle modification recommendations as noted below.   Total time of encounter: 40 minutes total time of encounter, including 25 minutes spent in face-to-face patient care. This time includes coordination of care and counseling regarding above mentioned problem list. Remainder of non-face-to-face time involved reviewing chart documents/testing relevant to the patient encounter and documentation in the medical record. I have independently reviewed documentation from referring provider.   Cherlynn Kaiser, MD [facility]    Medication Adjustments/Labs and Tests Ordered: Current medicines are reviewed at length with the patient today.  Concerns regarding medicines are outlined above.  Orders Placed This Encounter  Procedures  . EKG 12-Lead   No orders of the defined types were placed in this encounter.  Patient Instructions  Medication Instructions:  No changes *If you need a refill on your cardiac medications before your next appointment, please call your pharmacy*   Lab Work: Not needed   Testing/Procedures: Not needed   Follow-Up: At [facility], you and your health needs are our priority.  As part of our continuing mission to provide you with exceptional heart care, we have created designated Provider Care Teams.  These Care Teams include your primary Cardiologist (physician) and Advanced Practice Providers (APPs -  Physician Assistants and Nurse Practitioners) who all work together to provide you with the care you need, when you need it.  We recommend signing up for the patient portal called "MyChart".  Sign up information is provided on this After Visit Summary.  MyChart is used to connect with patients for Virtual Visits (Telemedicine).  Patients are able to view lab/test results, encounter notes, upcoming appointments, etc.  Non-urgent messages can be sent to your provider as well.   To  learn more about what you can do with MyChart, go to [email or web address].    Your next appointment:   6 month(s)  The format for your next appointment:   In Person  Provider:   Cherlynn Kaiser, MD

## 2019-04-26 ENCOUNTER — Telehealth: Payer: Self-pay | Admitting: Internal Medicine

## 2019-04-26 NOTE — Telephone Encounter (Signed)
Left detailed message as noted from [facility].

## 2019-04-26 NOTE — Telephone Encounter (Signed)
Turmeric will not interfere with her cardiac medications; does tend to increase risk of bruising for everyone

## 2019-04-26 NOTE — Telephone Encounter (Signed)
New message:     Patient calling to see if she could take turmeric curcumin 500 mg. Please call patient.

## 2019-10-11 ENCOUNTER — Other Ambulatory Visit: Payer: Self-pay | Admitting: Advanced Practice Midwife

## 2019-10-11 DIAGNOSIS — E041 Nontoxic single thyroid nodule: Secondary | ICD-10-CM

## 2019-10-28 ENCOUNTER — Ambulatory Visit
Admission: RE | Admit: 2019-10-28 | Discharge: 2019-10-28 | Disposition: A | Discharge status: Home / Self Care | Payer: BC Managed Care – PPO | Source: Ambulatory Visit | Attending: Advanced Practice Midwife | Admitting: Advanced Practice Midwife

## 2019-10-28 DIAGNOSIS — E041 Nontoxic single thyroid nodule: Secondary | ICD-10-CM

## 2019-11-19 ENCOUNTER — Telehealth: Payer: Self-pay | Admitting: Internal Medicine

## 2019-12-03 MED ORDER — ROSUVASTATIN CALCIUM 20 MG PO TABS: 20.0000 mg | ORAL_TABLET | Freq: Every day | ORAL | 3 refills | Status: DC

## 2019-12-03 NOTE — Telephone Encounter (Signed)
Patient scheduled with Dr. Margaretann Loveless on 11/10. She needs refill sent in today before 5pm. She will be out by tomorrow.

## 2019-12-03 NOTE — Addendum Note (Signed)
Addended by: Sherrie Mustache on: 12/03/2019 04:12 PM   Modules accepted: Orders

## 2019-12-03 NOTE — Telephone Encounter (Signed)
Prescription refill sent to preferred pharmacy, patient aware.

## 2019-12-03 NOTE — Telephone Encounter (Signed)
Patient calling back in in regards to this refill - only has enough medication to get her through to end of day Saturday.   *STAT* If patient is at the pharmacy, call can be transferred to refill team.   1. Which medications need to be refilled? (please list name of each medication and dose if known)  rosuvastatin (CRESTOR) 20 MG tablet(Expired)  2. Which pharmacy/location (including street and city if local pharmacy) is medication to be sent to? [id number] - [place], [place] - [address]  3. Do they need a 30 day or 90 day supply? 90 day supply   Patient says she was not due for follow up with Dr. Margaretann Loveless for 1 year (last appointment in March 2021) but will be open to coming in sooner if need be in order to get new prescription for medicine. If unable to refill please advise.

## 2019-12-08 ENCOUNTER — Ambulatory Visit (INDEPENDENT_AMBULATORY_CARE_PROVIDER_SITE_OTHER): Payer: Medicare Other | Admitting: Internal Medicine

## 2019-12-08 ENCOUNTER — Other Ambulatory Visit: Payer: Self-pay

## 2019-12-08 ENCOUNTER — Encounter: Payer: Self-pay | Admitting: Internal Medicine

## 2019-12-08 VITALS — BP 120/76 | HR 59 | Ht 60.0 in | Wt 136.6 lb

## 2019-12-08 DIAGNOSIS — E785 Hyperlipidemia, unspecified: Secondary | ICD-10-CM

## 2019-12-08 DIAGNOSIS — Z720 Tobacco use: Secondary | ICD-10-CM

## 2019-12-08 DIAGNOSIS — I25118 Atherosclerotic heart disease of native coronary artery with other forms of angina pectoris: Secondary | ICD-10-CM | POA: Diagnosis not present

## 2019-12-08 DIAGNOSIS — R931 Abnormal findings on diagnostic imaging of heart and coronary circulation: Secondary | ICD-10-CM | POA: Diagnosis not present

## 2019-12-08 NOTE — Progress Notes (Signed)
Cardiology Office Note:    Date:  12/08/2019   ID:  TIMICA MARCOM, DOB 06/22/1955, MRN [id number]  PCP:  Mayra Neer, MD  Cardiologist:  Elouise Munroe, MD  Electrophysiologist:  None   Referring MD: Mayra Neer, MD   Chief Complaint/Reason for Referral: CAC, DOE   History of Present Illness:    Katelyn Bell is a 64 y.o. female with a history of GERD, glaucoma, and rheumatoid arthritis who presents today for follow-up.   She has a strong family history of coronary artery disease detailed in my initial note.  For her dyspnea on exertion and coronary calcifications, we performed an exercise echo.  This was felt to be low risk with maximal exercise.  For hyperlipidemia and coronary artery calcifications we initiated high intensity statin therapy with rosuvastatin 20 mg daily.  She had a period of dizziness after starting rosuvastatin, however after visiting her rheumatologist and reinitiating her folic acid and B vitamin supplementation, the dizziness went away.  She is on methotrexate for rheumatoid arthritis.  She is continued on Crestor 20 mg daily without incident.   The patient denies chest pain, chest pressure, dyspnea at rest or with exertion, palpitations, PND, orthopnea, or leg swelling. Denies cough, fever, chills. Denies nausea, vomiting. Denies syncope or presyncope. Denies dizziness or lightheadedness.   Continues to smoke but is working on quitting. This has been more difficult to do after the death of her mother in 02-04-19.  Past Medical History:  Diagnosis Date   Fluid retention    GERD (gastroesophageal reflux disease)    Glaucoma    Osteoarthritis    Rheumatoid arthritis(714.0)     Past Surgical History:  Procedure Laterality Date   CARPAL TUNNEL RELEASE  ~ 2 years   bilateral   KNEE ARTHROSCOPY  07/17/11   right   PARTIAL HYSTERECTOMY     TONSILLECTOMY     TOTAL KNEE ARTHROPLASTY  09/11/2011   Procedure: TOTAL KNEE ARTHROPLASTY;   Surgeon: Kerin Salen, MD;  Location: [facility];  Service: Orthopedics;  Laterality: Right;  Right total knee Arthroplasty    Current Medications: Current Meds  Medication Sig   Adalimumab (HUMIRA) 40 MG/0.8ML PSKT Inject 1 application into the skin every 14 (fourteen) days.    brimonidine-timolol (COMBIGAN) 0.2-0.5 % ophthalmic solution Place 1 drop into both eyes 2 (two) times daily.   calcium carbonate (OS-CAL) 1250 MG chewable tablet Chew 1 tablet by mouth daily.   Cetirizine HCl (ZYRTEC ALLERGY PO) Take by mouth.   diazepam (VALIUM) 5 MG tablet Take 5 mg by mouth every 8 (eight) hours as needed for anxiety.    estradiol (ESTRACE) 1 MG tablet Take 1.5 mg by mouth daily.   fish oil-omega-3 fatty acids 1000 MG capsule Take 1 g by mouth daily.   folic acid (FOLVITE) 1 MG tablet Take 2 mg by mouth daily.   methotrexate (RHEUMATREX) 2.5 MG tablet Take 2.5 mg by mouth once a week. Caution:Chemotherapy. Protect from light.    montelukast (SINGULAIR) 10 MG tablet Take 10 mg by mouth at bedtime.   mupirocin ointment (BACTROBAN) 2 % Apply to wound twice a day.   omeprazole (PRILOSEC) 20 MG capsule Take 20 mg by mouth daily.   ondansetron (ZOFRAN) 4 MG tablet Take 1 tablet (4 mg total) by mouth every 8 (eight) hours as needed for nausea or vomiting.   rosuvastatin (CRESTOR) 20 MG tablet Take 1 tablet (20 mg total) by mouth daily.   vitamin C (  ASCORBIC ACID) 500 MG tablet Take 1,000 mg by mouth daily.   vitamin E 200 UNIT capsule Take 200 Units by mouth daily.     Allergies:   Clindamycin/lincomycin and Penicillins   Social History   Tobacco Use   Smoking status: Current Every Day Smoker    Packs/day: 1.00    Years: 20.00    Pack years: 20.00   Smokeless tobacco: Never Used  Substance Use Topics   Alcohol use: No    Alcohol/week: 0.0 standard drinks   Drug use: No     Family History: The patient's family history includes Diabetes in her father.  ROS:   Please see  the history of present illness.    All other systems reviewed and are negative.  EKGs/Labs/Other Studies Reviewed:    The following studies were reviewed today:  EKG:  Sinus bradycardia rate 59, poor R wave progression  Recent Labs: No results found for requested labs within last 8760 hours.  Recent Lipid Panel No results found for: CHOL, TRIG, HDL, CHOLHDL, VLDL, LDLCALC, LDLDIRECT  Physical Exam:    VS:  BP 120/76    Pulse (!) 59    Ht 5' (1.524 m)    Wt 136 lb 9.6 oz (62 kg)    SpO2 98%    BMI 26.68 kg/m     Wt Readings from Last 5 Encounters:  04/12/19 139 lb 9.6 oz (63.3 kg)  09/03/18 130 lb (59 kg)  04/24/18 134 lb (60.8 kg)  02/24/18 134 lb 3.2 oz (60.9 kg)  11/19/17 132 lb (59.9 kg)    Constitutional: No acute distress Cardiovascular: regular rhythm, normal rate, no murmurs. S1 and S2 normal. Radial pulses normal bilaterally. No jugular venous distention.  Respiratory: clear to auscultation bilaterally GI : normal bowel sounds, soft and nontender. No distention.   MSK: extremities warm, well perfused. No edema.  NEURO: grossly nonfocal exam, moves all extremities. PSYCH: alert and oriented x 3, normal mood and affect.   ASSESSMENT:    1. 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 Agatston coronary artery calcium score between 200 and 399   3. Tobacco abuse   4. Hyperlipidemia, unspecified hyperlipidemia type    PLAN:    Continue crestor for CAC and hyperlipidemia, lipids are at goal, LDL is 59 from Sept 2021.   Tobacco use - encouraged cessation and recommended quitting resources.  Total time of encounter: 20 minutes total time of encounter, including 10 minutes spent in face-to-face patient care on the date of this encounter. This time includes coordination of care and counseling regarding above mentioned problem list. Remainder of non-face-to-face time involved reviewing  chart documents/testing relevant to the patient encounter and documentation in the medical record. I have independently reviewed documentation from referring provider.   Cherlynn Kaiser, MD [place]   [facility]    Medication Adjustments/Labs and Tests Ordered: Current medicines are reviewed at length with the patient today.  Concerns regarding medicines are outlined above.   Orders Placed This Encounter  Procedures   EKG 12-Lead    No orders of the defined types were placed in this encounter.   Patient Instructions  Medication Instructions:  No Changes In Medications at this time.  *If you need a refill on your cardiac medications before your next appointment, please call your pharmacy*  Lab Work: None Ordered At This Time.  If you have labs (blood work) drawn today  and your tests are completely normal, you will receive your results only by:  MyChart Message (if you have MyChart) OR  A paper copy in the mail If you have any lab test that is abnormal or we need to change your treatment, we will call you to review the results.  Testing/Procedures: None Ordered At This Time.   Follow-Up: At [facility], you and your health needs are our priority.  As part of our continuing mission to provide you with exceptional heart care, we have created designated Provider Care Teams.  These Care Teams include your primary Cardiologist (physician) and Advanced Practice Providers (APPs -  Physician Assistants and Nurse Practitioners) who all work together to provide you with the care you need, when you need it.  We recommend signing up for the patient portal called "MyChart".  Sign up information is provided on this After Visit Summary.  MyChart is used to connect with patients for Virtual Visits (Telemedicine).  Patients are able to view lab/test results, encounter notes, upcoming appointments, etc.  Non-urgent messages can be sent to your provider as well.   To learn more about what  you can do with MyChart, go to [email or web address].    Your next appointment:   1 year(s)  The format for your next appointment:   In Person  Provider:   Cherlynn Kaiser, MD

## 2019-12-08 NOTE — Patient Instructions (Signed)
Medication Instructions:  No Changes In Medications at this time.  *If you need a refill on your cardiac medications before your next appointment, please call your pharmacy*  Lab Work: None Ordered At This Time.  If you have labs (blood work) drawn today and your tests are completely normal, you will receive your results only by: . MyChart Message (if you have MyChart) OR . A paper copy in the mail If you have any lab test that is abnormal or we need to change your treatment, we will call you to review the results.  Testing/Procedures: None Ordered At This Time.   Follow-Up: At [facility], you and your health needs are our priority.  As part of our continuing mission to provide you with exceptional heart care, we have created designated Provider Care Teams.  These Care Teams include your primary Cardiologist (physician) and Advanced Practice Providers (APPs -  Physician Assistants and Nurse Practitioners) who all work together to provide you with the care you need, when you need it.  We recommend signing up for the patient portal called "MyChart".  Sign up information is provided on this After Visit Summary.  MyChart is used to connect with patients for Virtual Visits (Telemedicine).  Patients are able to view lab/test results, encounter notes, upcoming appointments, etc.  Non-urgent messages can be sent to your provider as well.   To learn more about what you can do with MyChart, go to [email or web address].    Your next appointment:   1 year(s)  The format for your next appointment:   In Person  Provider:   Gayatri Acharya, MD   

## 2020-02-02 DIAGNOSIS — H401132 Primary open-angle glaucoma, bilateral, moderate stage: Secondary | ICD-10-CM | POA: Diagnosis not present

## 2020-02-23 DIAGNOSIS — M150 Primary generalized (osteo)arthritis: Secondary | ICD-10-CM | POA: Diagnosis not present

## 2020-02-23 DIAGNOSIS — M0600 Rheumatoid arthritis without rheumatoid factor, unspecified site: Secondary | ICD-10-CM | POA: Diagnosis not present

## 2020-02-23 DIAGNOSIS — L4050 Arthropathic psoriasis, unspecified: Secondary | ICD-10-CM | POA: Diagnosis not present

## 2020-02-23 DIAGNOSIS — M79671 Pain in right foot: Secondary | ICD-10-CM | POA: Diagnosis not present

## 2020-02-23 DIAGNOSIS — M79643 Pain in unspecified hand: Secondary | ICD-10-CM | POA: Diagnosis not present

## 2020-02-23 DIAGNOSIS — K219 Gastro-esophageal reflux disease without esophagitis: Secondary | ICD-10-CM | POA: Diagnosis not present

## 2020-02-23 DIAGNOSIS — Z79899 Other long term (current) drug therapy: Secondary | ICD-10-CM | POA: Diagnosis not present

## 2020-02-23 DIAGNOSIS — L409 Psoriasis, unspecified: Secondary | ICD-10-CM | POA: Diagnosis not present

## 2020-02-25 DIAGNOSIS — Z79899 Other long term (current) drug therapy: Secondary | ICD-10-CM | POA: Diagnosis not present

## 2020-03-24 ENCOUNTER — Other Ambulatory Visit: Payer: Self-pay

## 2020-03-24 MED ORDER — ROSUVASTATIN CALCIUM 20 MG PO TABS
20.0000 mg | ORAL_TABLET | Freq: Every day | ORAL | 2 refills | Status: DC
Start: 2020-03-24 — End: 2020-08-04

## 2020-05-24 DIAGNOSIS — M0600 Rheumatoid arthritis without rheumatoid factor, unspecified site: Secondary | ICD-10-CM | POA: Diagnosis not present

## 2020-05-24 DIAGNOSIS — Z79899 Other long term (current) drug therapy: Secondary | ICD-10-CM | POA: Diagnosis not present

## 2020-05-24 DIAGNOSIS — L4050 Arthropathic psoriasis, unspecified: Secondary | ICD-10-CM | POA: Diagnosis not present

## 2020-05-24 DIAGNOSIS — M25512 Pain in left shoulder: Secondary | ICD-10-CM | POA: Diagnosis not present

## 2020-05-24 DIAGNOSIS — M79671 Pain in right foot: Secondary | ICD-10-CM | POA: Diagnosis not present

## 2020-05-24 DIAGNOSIS — L409 Psoriasis, unspecified: Secondary | ICD-10-CM | POA: Diagnosis not present

## 2020-05-24 DIAGNOSIS — M79643 Pain in unspecified hand: Secondary | ICD-10-CM | POA: Diagnosis not present

## 2020-05-24 DIAGNOSIS — M150 Primary generalized (osteo)arthritis: Secondary | ICD-10-CM | POA: Diagnosis not present

## 2020-05-24 DIAGNOSIS — K219 Gastro-esophageal reflux disease without esophagitis: Secondary | ICD-10-CM | POA: Diagnosis not present

## 2020-06-12 DIAGNOSIS — H401132 Primary open-angle glaucoma, bilateral, moderate stage: Secondary | ICD-10-CM | POA: Diagnosis not present

## 2020-06-12 DIAGNOSIS — H10413 Chronic giant papillary conjunctivitis, bilateral: Secondary | ICD-10-CM | POA: Diagnosis not present

## 2020-06-12 DIAGNOSIS — H2513 Age-related nuclear cataract, bilateral: Secondary | ICD-10-CM | POA: Diagnosis not present

## 2020-06-22 DIAGNOSIS — M069 Rheumatoid arthritis, unspecified: Secondary | ICD-10-CM | POA: Diagnosis not present

## 2020-08-04 ENCOUNTER — Telehealth: Payer: Self-pay | Admitting: Internal Medicine

## 2020-08-04 MED ORDER — ROSUVASTATIN CALCIUM 20 MG PO TABS
20.0000 mg | ORAL_TABLET | Freq: Every day | ORAL | 3 refills | Status: DC
Start: 2020-08-04 — End: 2021-12-07

## 2020-08-04 NOTE — Telephone Encounter (Signed)
*  STAT* If patient is at the pharmacy, call can be transferred to refill team.   1. Which medications need to be refilled? (please list name of each medication and dose if known) rosuvastatin (CRESTOR) 20 MG tablet  2. Which pharmacy/location (including street and city if local pharmacy) is medication to be sent to? [facility]  ([place], [place]  3. Do they need a 30 day or 90 day supply? [place]

## 2020-08-04 NOTE — Telephone Encounter (Signed)
Called patient left message on personal voice mail crestor refill sent to [facility].

## 2020-08-23 DIAGNOSIS — M25512 Pain in left shoulder: Secondary | ICD-10-CM | POA: Diagnosis not present

## 2020-08-23 DIAGNOSIS — L409 Psoriasis, unspecified: Secondary | ICD-10-CM | POA: Diagnosis not present

## 2020-08-23 DIAGNOSIS — M150 Primary generalized (osteo)arthritis: Secondary | ICD-10-CM | POA: Diagnosis not present

## 2020-08-23 DIAGNOSIS — K219 Gastro-esophageal reflux disease without esophagitis: Secondary | ICD-10-CM | POA: Diagnosis not present

## 2020-08-23 DIAGNOSIS — L4050 Arthropathic psoriasis, unspecified: Secondary | ICD-10-CM | POA: Diagnosis not present

## 2020-08-23 DIAGNOSIS — M0600 Rheumatoid arthritis without rheumatoid factor, unspecified site: Secondary | ICD-10-CM | POA: Diagnosis not present

## 2020-08-23 DIAGNOSIS — M79671 Pain in right foot: Secondary | ICD-10-CM | POA: Diagnosis not present

## 2020-09-13 DIAGNOSIS — H401132 Primary open-angle glaucoma, bilateral, moderate stage: Secondary | ICD-10-CM | POA: Diagnosis not present

## 2020-10-12 DIAGNOSIS — Z23 Encounter for immunization: Secondary | ICD-10-CM | POA: Diagnosis not present

## 2020-10-12 DIAGNOSIS — M069 Rheumatoid arthritis, unspecified: Secondary | ICD-10-CM | POA: Diagnosis not present

## 2020-10-12 DIAGNOSIS — R7303 Prediabetes: Secondary | ICD-10-CM | POA: Diagnosis not present

## 2020-10-12 DIAGNOSIS — Z0000 Encounter for general adult medical examination without abnormal findings: Secondary | ICD-10-CM | POA: Diagnosis not present

## 2020-10-12 DIAGNOSIS — I10 Essential (primary) hypertension: Secondary | ICD-10-CM | POA: Diagnosis not present

## 2020-10-12 DIAGNOSIS — Z1159 Encounter for screening for other viral diseases: Secondary | ICD-10-CM | POA: Diagnosis not present

## 2020-10-12 DIAGNOSIS — M8580 Other specified disorders of bone density and structure, unspecified site: Secondary | ICD-10-CM | POA: Diagnosis not present

## 2020-10-12 DIAGNOSIS — E041 Nontoxic single thyroid nodule: Secondary | ICD-10-CM | POA: Diagnosis not present

## 2020-10-12 DIAGNOSIS — I2510 Atherosclerotic heart disease of native coronary artery without angina pectoris: Secondary | ICD-10-CM | POA: Diagnosis not present

## 2020-10-12 DIAGNOSIS — E782 Mixed hyperlipidemia: Secondary | ICD-10-CM | POA: Diagnosis not present

## 2020-10-12 DIAGNOSIS — J449 Chronic obstructive pulmonary disease, unspecified: Secondary | ICD-10-CM | POA: Diagnosis not present

## 2020-10-16 ENCOUNTER — Other Ambulatory Visit: Payer: Self-pay | Admitting: Family Medicine

## 2020-10-16 DIAGNOSIS — E041 Nontoxic single thyroid nodule: Secondary | ICD-10-CM

## 2020-10-19 ENCOUNTER — Ambulatory Visit
Admission: RE | Admit: 2020-10-19 | Discharge: 2020-10-19 | Disposition: A | Discharge status: Home / Self Care | Payer: Medicare Other | Source: Ambulatory Visit | Attending: Family Medicine | Admitting: Family Medicine

## 2020-10-19 DIAGNOSIS — E041 Nontoxic single thyroid nodule: Secondary | ICD-10-CM

## 2020-11-02 DIAGNOSIS — M85852 Other specified disorders of bone density and structure, left thigh: Secondary | ICD-10-CM | POA: Diagnosis not present

## 2020-11-02 DIAGNOSIS — M85851 Other specified disorders of bone density and structure, right thigh: Secondary | ICD-10-CM | POA: Diagnosis not present

## 2020-11-02 DIAGNOSIS — Z780 Asymptomatic menopausal state: Secondary | ICD-10-CM | POA: Diagnosis not present

## 2020-11-24 DIAGNOSIS — J209 Acute bronchitis, unspecified: Secondary | ICD-10-CM | POA: Diagnosis not present

## 2020-11-24 DIAGNOSIS — R051 Acute cough: Secondary | ICD-10-CM | POA: Diagnosis not present

## 2020-11-24 DIAGNOSIS — R0981 Nasal congestion: Secondary | ICD-10-CM | POA: Diagnosis not present

## 2020-11-24 DIAGNOSIS — J0100 Acute maxillary sinusitis, unspecified: Secondary | ICD-10-CM | POA: Diagnosis not present

## 2020-11-29 ENCOUNTER — Other Ambulatory Visit: Payer: Self-pay | Admitting: *Deleted

## 2020-11-29 DIAGNOSIS — M0600 Rheumatoid arthritis without rheumatoid factor, unspecified site: Secondary | ICD-10-CM | POA: Diagnosis not present

## 2020-11-29 DIAGNOSIS — M150 Primary generalized (osteo)arthritis: Secondary | ICD-10-CM | POA: Diagnosis not present

## 2020-11-29 DIAGNOSIS — R059 Cough, unspecified: Secondary | ICD-10-CM | POA: Diagnosis not present

## 2020-11-29 DIAGNOSIS — L409 Psoriasis, unspecified: Secondary | ICD-10-CM | POA: Diagnosis not present

## 2020-11-29 DIAGNOSIS — Z79899 Other long term (current) drug therapy: Secondary | ICD-10-CM | POA: Diagnosis not present

## 2020-11-29 DIAGNOSIS — F17210 Nicotine dependence, cigarettes, uncomplicated: Secondary | ICD-10-CM

## 2020-11-29 DIAGNOSIS — L4050 Arthropathic psoriasis, unspecified: Secondary | ICD-10-CM | POA: Diagnosis not present

## 2020-11-29 DIAGNOSIS — Z87891 Personal history of nicotine dependence: Secondary | ICD-10-CM

## 2020-11-29 DIAGNOSIS — K219 Gastro-esophageal reflux disease without esophagitis: Secondary | ICD-10-CM | POA: Diagnosis not present

## 2020-12-19 ENCOUNTER — Telehealth (INDEPENDENT_AMBULATORY_CARE_PROVIDER_SITE_OTHER): Payer: Medicare Other | Admitting: Internal Medicine

## 2020-12-19 ENCOUNTER — Encounter: Payer: Self-pay | Admitting: Internal Medicine

## 2020-12-19 VITALS — BP 118/75 | HR 69 | Ht 60.0 in | Wt 130.0 lb

## 2020-12-19 DIAGNOSIS — R931 Abnormal findings on diagnostic imaging of heart and coronary circulation: Secondary | ICD-10-CM | POA: Diagnosis not present

## 2020-12-19 DIAGNOSIS — I25118 Atherosclerotic heart disease of native coronary artery with other forms of angina pectoris: Secondary | ICD-10-CM

## 2020-12-19 DIAGNOSIS — Z720 Tobacco use: Secondary | ICD-10-CM | POA: Diagnosis not present

## 2020-12-19 DIAGNOSIS — E785 Hyperlipidemia, unspecified: Secondary | ICD-10-CM | POA: Diagnosis not present

## 2020-12-19 NOTE — Progress Notes (Signed)
Virtual Visit via Video Note   This visit type was conducted due to national recommendations for restrictions regarding the COVID-19 Pandemic (e.g. social distancing) in an effort to limit this patient's exposure and mitigate transmission in our community.  Due to her co-morbid illnesses, this patient is at least at moderate risk for complications without adequate follow up.  This format is felt to be most appropriate for this patient at this time.  All issues noted in this document were discussed and addressed.  A limited physical exam was performed with this format.  Please refer to the patient's chart for her consent to telehealth for [facility].       Date:  12/19/2020   ID:  Katelyn Bell, DOB 09-Nov-1955, MRN [id number] The patient was identified using 2 identifiers.  Patient Location: Home Provider Location: Office/Clinic   PCP:  Mayra Neer, MD   [facility] Providers Cardiologist:  Elouise Munroe, MD     Evaluation Performed:  Follow-Up Visit  Chief Complaint:  CAC  History of Present Illness:    Katelyn Bell is a 65 y.o. female with GERD, glaucoma, and rheumatoid arthritis who presents today for follow-up.    Feeling well from CV perspective. Recovering from bronchitis associated with RSV infection, still coughing but improving. Negative for covid. The patient denies chest pain, chest pressure, dyspnea at rest or with exertion, palpitations, PND, orthopnea, or leg swelling. Denies fever, chills. Denies nausea, vomiting. Denies syncope or presyncope. Denies dizziness or lightheadedness.  Still smoking, but will consider quitting.   The patient does not have symptoms concerning for COVID-19 infection (fever, chills, cough, or new shortness of breath).    Past Medical History:  Diagnosis Date   Fluid retention    GERD (gastroesophageal reflux disease)    Glaucoma    Osteoarthritis    Rheumatoid arthritis(714.0)    Past Surgical History:   Procedure Laterality Date   CARPAL TUNNEL RELEASE  ~ 2 years   bilateral   KNEE ARTHROSCOPY  07/17/11   right   PARTIAL HYSTERECTOMY     TONSILLECTOMY     TOTAL KNEE ARTHROPLASTY  09/11/2011   Procedure: TOTAL KNEE ARTHROPLASTY;  Surgeon: Kerin Salen, MD;  Location: [place];  Service: Orthopedics;  Laterality: Right;  Right total knee Arthroplasty     Current Meds  Medication Sig   Adalimumab 40 MG/0.8ML PSKT Inject 1 application into the skin every 14 (fourteen) days.    calcium carbonate (OS-CAL) 1250 MG chewable tablet Chew 1 tablet by mouth daily.   Cetirizine HCl (ZYRTEC ALLERGY PO) Take by mouth.   diazepam (VALIUM) 5 MG tablet Take 5 mg by mouth every 8 (eight) hours as needed for anxiety.    estradiol (ESTRACE) 1 MG tablet Take 1.5 mg by mouth daily.   fish oil-omega-3 fatty acids 1000 MG capsule Take 1 g by mouth daily.   folic acid (FOLVITE) 1 MG tablet Take 2 mg by mouth daily.   methotrexate (RHEUMATREX) 2.5 MG tablet Take 2.5 mg by mouth once a week. Caution:Chemotherapy. Protect from light.    montelukast (SINGULAIR) 10 MG tablet Take 10 mg by mouth at bedtime.   mupirocin ointment (BACTROBAN) 2 % Apply to wound twice a day.   omeprazole (PRILOSEC) 20 MG capsule Take 20 mg by mouth daily.   ondansetron (ZOFRAN) 4 MG tablet Take 1 tablet (4 mg total) by mouth every 8 (eight) hours as needed for nausea or vomiting.   predniSONE (DELTASONE) 5  MG tablet Take 5 mg by mouth daily with breakfast.   risedronate (ACTONEL) 35 MG tablet Take 35 mg by mouth every 7 (seven) days. with water on empty stomach, nothing by mouth or lie down for next 30 minutes.   rosuvastatin (CRESTOR) 20 MG tablet Take 1 tablet (20 mg total) by mouth daily.   vitamin C (ASCORBIC ACID) 500 MG tablet Take 1,000 mg by mouth daily.   vitamin E 200 UNIT capsule Take 200 Units by mouth daily.     Allergies:   Clindamycin/lincomycin and Penicillins   Social History   Tobacco Use   Smoking status: Every  Day    Packs/day: 1.00    Years: 20.00    Pack years: 20.00    Types: Cigarettes   Smokeless tobacco: Never  Substance Use Topics   Alcohol use: No    Alcohol/week: 0.0 standard drinks   Drug use: No     Family Hx: The patient's family history includes Diabetes in her father.  ROS:   Please see the history of present illness.     All other systems reviewed and are negative.   Prior CV studies:   The following studies were reviewed today:    Labs/Other Tests and Data Reviewed:    EKG:  No ECG reviewed.  Recent Labs: No results found for requested labs within last 8760 hours.   Recent Lipid Panel No results found for: CHOL, TRIG, HDL, CHOLHDL, LDLCALC, LDLDIRECT  Wt Readings from Last 3 Encounters:  12/19/20 130 lb (59 kg)  12/08/19 136 lb 9.6 oz (62 kg)  04/12/19 139 lb 9.6 oz (63.3 kg)     Risk Assessment/Calculations:          Objective:    Vital Signs:  BP 118/75   Pulse 69   Ht 5' (1.524 m)   Wt 130 lb (59 kg)   BMI 25.39 kg/m    VITAL SIGNS:  reviewed GEN:  no acute distress EYES:  sclerae anicteric, EOMI - Extraocular Movements Intact RESPIRATORY:  normal respiratory effort, symmetric expansion CARDIOVASCULAR:  no peripheral edema SKIN:  no rash, lesions or ulcers. MUSCULOSKELETAL:  no obvious deformities. 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    Tobacco abuse - Encouraged cessation, she will try.  COVID-19 Education: The signs and symptoms of COVID-19 were discussed with the patient and how to seek care for testing (follow up with PCP or arrange E-visit).  The importance of social distancing was discussed today.  Time:    Today, I have spent 15 minutes with the patient with telehealth technology discussing the above problems.     Medication Adjustments/Labs and Tests Ordered: Current medicines are reviewed at length with the patient today.  Concerns regarding medicines are outlined above.   Tests Ordered: No orders of the defined types were placed in this encounter.   Medication Changes: No orders of the defined types were placed in this encounter.  Patient Instructions  Medication Instructions:  No Changes In Medications at this time.  *If you need a refill on your cardiac medications before your next appointment, please call your pharmacy*  Follow-Up: At [facility], you and your health needs are our  priority.  As part of our continuing mission to provide you with exceptional heart care, we have created designated Provider Care Teams.  These Care Teams include your primary Cardiologist (physician) and Advanced Practice Providers (APPs -  Physician Assistants and Nurse Practitioners) who all work together to provide you with the care you need, when you need it.  Your next appointment:   1 year(s)  The format for your next appointment:   In Person  Provider:   Elouise Munroe, MD      Signed, Elouise Munroe, MD  12/19/2020 9:14 AM    [place]

## 2020-12-19 NOTE — Patient Instructions (Signed)
Medication Instructions:  No Changes In Medications at this time.   *If you need a refill on your cardiac medications before your next appointment, please call your pharmacy*  Follow-Up: At [facility], you and your health needs are our priority.  As part of our continuing mission to provide you with exceptional heart care, we have created designated Provider Care Teams.  These Care Teams include your primary Cardiologist (physician) and Advanced Practice Providers (APPs -  Physician Assistants and Nurse Practitioners) who all work together to provide you with the care you need, when you need it.  Your next appointment:    1 year(s)  The format for your next appointment:   In Person  Provider:   Gayatri A Acharya, MD          

## 2021-01-03 ENCOUNTER — Telehealth (INDEPENDENT_AMBULATORY_CARE_PROVIDER_SITE_OTHER): Payer: Medicare Other | Admitting: Acute Care

## 2021-01-03 ENCOUNTER — Encounter: Payer: Self-pay | Admitting: Acute Care

## 2021-01-03 ENCOUNTER — Other Ambulatory Visit: Payer: Self-pay

## 2021-01-03 DIAGNOSIS — F17210 Nicotine dependence, cigarettes, uncomplicated: Secondary | ICD-10-CM

## 2021-01-03 NOTE — Patient Instructions (Signed)
Thank you for participating in the [place] [facility] Screening Program. °It was our pleasure to meet you today. °We will call you with the results of your scan within the next few days. °Your scan will be assigned a Lung RADS category score by the physicians reading the scans.  °This Lung RADS score determines follow up scanning.  °See below for description of categories, and follow up screening recommendations. °We will be in touch to schedule your follow up screening annually or based on recommendations of our providers. °We will fax a copy of your scan results to your Primary Care Physician, or the physician who referred you to the program, to ensure they have the results. °Please call the office if you have any questions or concerns regarding your scanning experience or results.  °Our office number is [phone]. °Please speak with Denise Phelps, RN. She is our Lung Cancer Screening RN. °If she is unavailable when you call, please have the office staff send her a message. She will return your call at her earliest convenience. °Remember, if your scan is normal, we will scan you annually as long as you continue to meet the criteria for the program. (Age 55-77, Current smoker or smoker who has quit within the last 15 years). °If you are a smoker, remember, quitting is the single most powerful action that you can take to decrease your risk of lung cancer and other pulmonary, breathing related problems. °We know quitting is hard, and we are here to help.  °Please let us know if there is anything we can do to help you meet your goal of quitting. °If you are a former smoker, congratulations. We are proud of you! Remain smoke free! °Remember you can refer friends or family members through the number above.  °We will screen them to make sure they meet criteria for the program. °Thank you for helping us take better care of you by participating in Lung Screening. ° °You can receive free nicotine replacement therapy  ( patches, gum or mints) by calling 1-800-QUIT NOW. Please call so we can get you on the path to becoming  a non-smoker. I know it is hard, but you can do this! ° °Lung RADS Categories: ° °Lung RADS 1: no nodules or definitely non-concerning nodules.  °Recommendation is for a repeat annual scan in 12 months. ° °Lung RADS 2:  nodules that are non-concerning in appearance and behavior with a very low likelihood of becoming an active cancer. °Recommendation is for a repeat annual scan in 12 months. ° °Lung RADS 3: nodules that are probably non-concerning , includes nodules with a low likelihood of becoming an active cancer.  Recommendation is for a 6-month repeat screening scan. Often noted after an upper respiratory illness. We will be in touch to make sure you have no questions, and to schedule your 6-month scan. ° °Lung RADS 4 A: nodules with concerning findings, recommendation is most often for a follow up scan in 3 months or additional testing based on our provider's assessment of the scan. We will be in touch to make sure you have no questions and to schedule the recommended 3 month follow up scan. ° °Lung RADS 4 B:  indicates findings that are concerning. We will be in touch with you to schedule additional diagnostic testing based on our provider's  assessment of the scan. ° °Hypnosis for smoking cessation  °[employer] °[phone] ° °Acupuncture for smoking cessation  °[facility] °[phone]  °

## 2021-01-03 NOTE — Progress Notes (Signed)
Virtual Visit via Video Note  I connected with Katelyn Bell on 01/03/21 at  9:30 AM EST by a video enabled telemedicine application and verified that I am speaking with the correct person using two identifiers.  Location: Patient: At home Provider: [place], [place], [place], [address]      I discussed the assessment and treatment plan with the patient. The patient was provided an opportunity to ask questions and all were answered. The patient agreed with the plan and demonstrated an understanding of the instructions.   The patient was advised to call back or seek an in-person evaluation if the symptoms worsen or if the condition fails to improve as anticipated.   Patient was unable to sign on to the video visit as her I pad did not interface with the [facility] health platform. This was done by phone   Shared Decision Making Visit Lung Cancer Screening Program [phone])   Eligibility: Age 65 y.o. Pack Years Smoking History Calculation 50 pack year smoking history (# packs/per year x # years smoked) Recent History of coughing up blood  no Unexplained weight loss? no ( >Than 15 pounds within the last 6 months ) Prior History Lung / other cancer no (Diagnosis within the last 5 years already requiring surveillance chest CT Scans). Smoking Status Current Smoker Former Smokers: Years since quit: NA  Quit Date: NA  Visit Components: Discussion included one or more decision making aids. yes Discussion included risk/benefits of screening. yes Discussion included potential follow up diagnostic testing for abnormal scans. yes Discussion included meaning and risk of over diagnosis. yes Discussion included meaning and risk of False Positives. yes Discussion included meaning of total radiation exposure. yes  Counseling Included: Importance of adherence to annual lung cancer LDCT screening. yes Impact of comorbidities on ability to participate in the program. yes Ability and  willingness to under diagnostic treatment. yes  Smoking Cessation Counseling: Current Smokers:  Discussed importance of smoking cessation. yes Information about tobacco cessation classes and interventions provided to patient. yes Patient provided with "ticket" for LDCT Scan. yes Symptomatic Patient. no  Counseling Diagnosis Code: Tobacco Use Z72.0 Asymptomatic Patient yes  Counseling (Intermediate counseling: > three minutes counseling) H7026 Former Smokers:  Discussed the importance of maintaining cigarette abstinence. yes Diagnosis Code: Personal History of Nicotine Dependence. V78.588 Information about tobacco cessation classes and interventions provided to patient. Yes Patient provided with "ticket" for LDCT Scan. yes Written Order for Lung Cancer Screening with LDCT placed in Epic. Yes (CT Chest Lung Cancer Screening Low Dose W/O CM) [id number] Z12.2-Screening of respiratory organs Z87.891-Personal history of nicotine dependence   I have spent 25 minutes of face to face/ virtual visit   time with Katelyn Bell discussing the risks and benefits of lung cancer screening. We viewed / discussed a power point together that explained in detail the above noted topics. We paused at intervals to allow for questions to be asked and answered to ensure understanding.We discussed that the single most powerful action that she can take to decrease her risk of developing lung cancer is to quit smoking. We discussed whether or not she is ready to commit to setting a quit date. We discussed options for tools to aid in quitting smoking including nicotine replacement therapy, non-nicotine medications, support groups, Quit Smart classes, and behavior modification. We discussed that often times setting smaller, more achievable goals, such as eliminating 1 cigarette a day for a week and then 2 cigarettes a day for a week  can be helpful in slowly decreasing the number of cigarettes smoked. This allows for a sense of  accomplishment as well as providing a clinical benefit. I provided  her  with smoking cessation  information  with contact information for community resources, classes, free nicotine replacement therapy, and access to mobile apps, text messaging, and on-line smoking cessation help. I have also provided  her  the office contact information in the event she needs to contact me, or the screening staff. We discussed the time and location of the scan, and that either Doroteo Glassman RN, Joella Prince, RN  or I will call / send a letter with the results within 24-72 hours of receiving them. The patient verbalized understanding of all of  the above and had no further questions upon leaving the office. They have my contact information in the event they have any further questions.  I spent 3 minutes counseling on smoking cessation and the health risks of continued tobacco abuse.  I explained to the patient that there has been a high incidence of coronary artery disease noted on these exams. I explained that this is a non-gated exam therefore degree or severity cannot be determined. This patient is on statin therapy. I have asked the patient to follow-up with their PCP regarding any incidental finding of coronary artery disease and management with diet or medication as their PCP  feels is clinically indicated. The patient verbalized understanding of the above and had no further questions upon completion of the visit.      Magdalen Spatz, NP 01/03/2021

## 2021-01-04 ENCOUNTER — Ambulatory Visit
Admission: RE | Admit: 2021-01-04 | Discharge: 2021-01-04 | Disposition: A | Discharge status: Home / Self Care | Payer: Medicare Other | Source: Ambulatory Visit | Attending: Acute Care | Admitting: Acute Care

## 2021-01-04 DIAGNOSIS — Z87891 Personal history of nicotine dependence: Secondary | ICD-10-CM

## 2021-01-04 DIAGNOSIS — F17210 Nicotine dependence, cigarettes, uncomplicated: Secondary | ICD-10-CM

## 2021-01-17 ENCOUNTER — Other Ambulatory Visit: Payer: Self-pay | Admitting: Acute Care

## 2021-01-17 DIAGNOSIS — Z87891 Personal history of nicotine dependence: Secondary | ICD-10-CM

## 2021-01-17 DIAGNOSIS — F17210 Nicotine dependence, cigarettes, uncomplicated: Secondary | ICD-10-CM

## 2021-01-22 DIAGNOSIS — R051 Acute cough: Secondary | ICD-10-CM | POA: Diagnosis not present

## 2021-01-22 DIAGNOSIS — J209 Acute bronchitis, unspecified: Secondary | ICD-10-CM | POA: Diagnosis not present

## 2021-01-22 DIAGNOSIS — R509 Fever, unspecified: Secondary | ICD-10-CM | POA: Diagnosis not present

## 2021-01-26 ENCOUNTER — Ambulatory Visit
Admission: RE | Admit: 2021-01-26 | Discharge: 2021-01-26 | Disposition: A | Discharge status: Home / Self Care | Payer: Medicare Other | Source: Ambulatory Visit | Attending: Physician Assistant | Admitting: Physician Assistant

## 2021-01-26 ENCOUNTER — Other Ambulatory Visit: Payer: Self-pay | Admitting: Physician Assistant

## 2021-01-26 DIAGNOSIS — R059 Cough, unspecified: Secondary | ICD-10-CM | POA: Diagnosis not present

## 2021-01-26 DIAGNOSIS — J449 Chronic obstructive pulmonary disease, unspecified: Secondary | ICD-10-CM | POA: Diagnosis not present

## 2021-01-26 DIAGNOSIS — M069 Rheumatoid arthritis, unspecified: Secondary | ICD-10-CM | POA: Diagnosis not present

## 2021-01-26 DIAGNOSIS — Z7952 Long term (current) use of systemic steroids: Secondary | ICD-10-CM | POA: Diagnosis not present

## 2021-01-26 DIAGNOSIS — D849 Immunodeficiency, unspecified: Secondary | ICD-10-CM | POA: Diagnosis not present

## 2021-01-26 DIAGNOSIS — R0602 Shortness of breath: Secondary | ICD-10-CM | POA: Diagnosis not present

## 2021-01-26 DIAGNOSIS — R062 Wheezing: Secondary | ICD-10-CM | POA: Diagnosis not present

## 2021-01-26 DIAGNOSIS — F17200 Nicotine dependence, unspecified, uncomplicated: Secondary | ICD-10-CM | POA: Diagnosis not present

## 2021-01-29 ENCOUNTER — Other Ambulatory Visit: Payer: Self-pay | Admitting: Internal Medicine

## 2021-02-06 DIAGNOSIS — Z1231 Encounter for screening mammogram for malignant neoplasm of breast: Secondary | ICD-10-CM | POA: Diagnosis not present

## 2021-02-14 DIAGNOSIS — H2513 Age-related nuclear cataract, bilateral: Secondary | ICD-10-CM | POA: Diagnosis not present

## 2021-02-14 DIAGNOSIS — G43809 Other migraine, not intractable, without status migrainosus: Secondary | ICD-10-CM | POA: Diagnosis not present

## 2021-02-14 DIAGNOSIS — H401132 Primary open-angle glaucoma, bilateral, moderate stage: Secondary | ICD-10-CM | POA: Diagnosis not present

## 2021-03-01 DIAGNOSIS — Z79899 Other long term (current) drug therapy: Secondary | ICD-10-CM | POA: Diagnosis not present

## 2021-03-01 DIAGNOSIS — L4050 Arthropathic psoriasis, unspecified: Secondary | ICD-10-CM | POA: Diagnosis not present

## 2021-03-01 DIAGNOSIS — K219 Gastro-esophageal reflux disease without esophagitis: Secondary | ICD-10-CM | POA: Diagnosis not present

## 2021-03-01 DIAGNOSIS — L409 Psoriasis, unspecified: Secondary | ICD-10-CM | POA: Diagnosis not present

## 2021-03-01 DIAGNOSIS — M150 Primary generalized (osteo)arthritis: Secondary | ICD-10-CM | POA: Diagnosis not present

## 2021-03-01 DIAGNOSIS — M25512 Pain in left shoulder: Secondary | ICD-10-CM | POA: Diagnosis not present

## 2021-03-01 DIAGNOSIS — M0600 Rheumatoid arthritis without rheumatoid factor, unspecified site: Secondary | ICD-10-CM | POA: Diagnosis not present

## 2021-04-18 DIAGNOSIS — H401132 Primary open-angle glaucoma, bilateral, moderate stage: Secondary | ICD-10-CM | POA: Diagnosis not present

## 2021-04-18 DIAGNOSIS — H1045 Other chronic allergic conjunctivitis: Secondary | ICD-10-CM | POA: Diagnosis not present

## 2021-05-29 DIAGNOSIS — M0600 Rheumatoid arthritis without rheumatoid factor, unspecified site: Secondary | ICD-10-CM | POA: Diagnosis not present

## 2021-05-29 DIAGNOSIS — Z79899 Other long term (current) drug therapy: Secondary | ICD-10-CM | POA: Diagnosis not present

## 2021-05-29 DIAGNOSIS — K219 Gastro-esophageal reflux disease without esophagitis: Secondary | ICD-10-CM | POA: Diagnosis not present

## 2021-05-29 DIAGNOSIS — L4050 Arthropathic psoriasis, unspecified: Secondary | ICD-10-CM | POA: Diagnosis not present

## 2021-05-29 DIAGNOSIS — M150 Primary generalized (osteo)arthritis: Secondary | ICD-10-CM | POA: Diagnosis not present

## 2021-05-29 DIAGNOSIS — M25512 Pain in left shoulder: Secondary | ICD-10-CM | POA: Diagnosis not present

## 2021-05-29 DIAGNOSIS — L409 Psoriasis, unspecified: Secondary | ICD-10-CM | POA: Diagnosis not present

## 2021-08-11 ENCOUNTER — Other Ambulatory Visit: Payer: Self-pay | Admitting: Internal Medicine

## 2021-08-29 DIAGNOSIS — H401132 Primary open-angle glaucoma, bilateral, moderate stage: Secondary | ICD-10-CM | POA: Diagnosis not present

## 2021-09-02 DIAGNOSIS — L4050 Arthropathic psoriasis, unspecified: Secondary | ICD-10-CM | POA: Diagnosis not present

## 2021-09-03 DIAGNOSIS — M25512 Pain in left shoulder: Secondary | ICD-10-CM | POA: Diagnosis not present

## 2021-09-03 DIAGNOSIS — M150 Primary generalized (osteo)arthritis: Secondary | ICD-10-CM | POA: Diagnosis not present

## 2021-09-03 DIAGNOSIS — L4050 Arthropathic psoriasis, unspecified: Secondary | ICD-10-CM | POA: Diagnosis not present

## 2021-09-03 DIAGNOSIS — Z79899 Other long term (current) drug therapy: Secondary | ICD-10-CM | POA: Diagnosis not present

## 2021-09-03 DIAGNOSIS — L409 Psoriasis, unspecified: Secondary | ICD-10-CM | POA: Diagnosis not present

## 2021-09-03 DIAGNOSIS — M0600 Rheumatoid arthritis without rheumatoid factor, unspecified site: Secondary | ICD-10-CM | POA: Diagnosis not present

## 2021-09-03 DIAGNOSIS — M549 Dorsalgia, unspecified: Secondary | ICD-10-CM | POA: Diagnosis not present

## 2021-09-03 DIAGNOSIS — M8580 Other specified disorders of bone density and structure, unspecified site: Secondary | ICD-10-CM | POA: Diagnosis not present

## 2021-09-03 DIAGNOSIS — K219 Gastro-esophageal reflux disease without esophagitis: Secondary | ICD-10-CM | POA: Diagnosis not present

## 2021-10-23 DIAGNOSIS — I2510 Atherosclerotic heart disease of native coronary artery without angina pectoris: Secondary | ICD-10-CM | POA: Diagnosis not present

## 2021-10-23 DIAGNOSIS — I10 Essential (primary) hypertension: Secondary | ICD-10-CM | POA: Diagnosis not present

## 2021-10-23 DIAGNOSIS — L309 Dermatitis, unspecified: Secondary | ICD-10-CM | POA: Diagnosis not present

## 2021-10-23 DIAGNOSIS — E041 Nontoxic single thyroid nodule: Secondary | ICD-10-CM | POA: Diagnosis not present

## 2021-10-23 DIAGNOSIS — K589 Irritable bowel syndrome without diarrhea: Secondary | ICD-10-CM | POA: Diagnosis not present

## 2021-10-23 DIAGNOSIS — R7303 Prediabetes: Secondary | ICD-10-CM | POA: Diagnosis not present

## 2021-10-23 DIAGNOSIS — M069 Rheumatoid arthritis, unspecified: Secondary | ICD-10-CM | POA: Diagnosis not present

## 2021-10-23 DIAGNOSIS — E782 Mixed hyperlipidemia: Secondary | ICD-10-CM | POA: Diagnosis not present

## 2021-10-23 DIAGNOSIS — H409 Unspecified glaucoma: Secondary | ICD-10-CM | POA: Diagnosis not present

## 2021-10-23 DIAGNOSIS — Z0001 Encounter for general adult medical examination with abnormal findings: Secondary | ICD-10-CM | POA: Diagnosis not present

## 2021-10-24 DIAGNOSIS — E041 Nontoxic single thyroid nodule: Secondary | ICD-10-CM | POA: Diagnosis not present

## 2021-12-04 DIAGNOSIS — M150 Primary generalized (osteo)arthritis: Secondary | ICD-10-CM | POA: Diagnosis not present

## 2021-12-04 DIAGNOSIS — K219 Gastro-esophageal reflux disease without esophagitis: Secondary | ICD-10-CM | POA: Diagnosis not present

## 2021-12-04 DIAGNOSIS — Z79899 Other long term (current) drug therapy: Secondary | ICD-10-CM | POA: Diagnosis not present

## 2021-12-04 DIAGNOSIS — M549 Dorsalgia, unspecified: Secondary | ICD-10-CM | POA: Diagnosis not present

## 2021-12-04 DIAGNOSIS — M25512 Pain in left shoulder: Secondary | ICD-10-CM | POA: Diagnosis not present

## 2021-12-04 DIAGNOSIS — L4050 Arthropathic psoriasis, unspecified: Secondary | ICD-10-CM | POA: Diagnosis not present

## 2021-12-04 DIAGNOSIS — M0600 Rheumatoid arthritis without rheumatoid factor, unspecified site: Secondary | ICD-10-CM | POA: Diagnosis not present

## 2021-12-04 DIAGNOSIS — M8580 Other specified disorders of bone density and structure, unspecified site: Secondary | ICD-10-CM | POA: Diagnosis not present

## 2021-12-04 DIAGNOSIS — L409 Psoriasis, unspecified: Secondary | ICD-10-CM | POA: Diagnosis not present

## 2021-12-06 ENCOUNTER — Telehealth: Payer: Self-pay | Admitting: Internal Medicine

## 2021-12-06 NOTE — Telephone Encounter (Signed)
*  STAT* If patient is at the pharmacy, call can be transferred to refill team.   1. Which medications need to be refilled? (please list name of each medication and dose if known) rosuvastatin (CRESTOR) 20 MG tablet   2. Which pharmacy/location (including street and city if local pharmacy) is medication to be sent to? [id number] [place], [place] - [address]   3. Do they need a 30 day or 90 day supply? 90  Pt is completely out of this medication.

## 2021-12-07 ENCOUNTER — Encounter: Payer: Self-pay | Admitting: Internal Medicine

## 2021-12-07 MED ORDER — ROSUVASTATIN CALCIUM 20 MG PO TABS: 20.0000 mg | ORAL_TABLET | Freq: Every day | ORAL | 3 refills | Status: DC

## 2021-12-07 NOTE — Telephone Encounter (Signed)
Patient called back to confirm her refill for rosuvastatin (CRESTOR) 20 MG tablet  should be sent to [id number]- [place], [place] - [address] as she is out of this medication.

## 2021-12-26 DIAGNOSIS — H401132 Primary open-angle glaucoma, bilateral, moderate stage: Secondary | ICD-10-CM | POA: Diagnosis not present

## 2021-12-26 DIAGNOSIS — H2513 Age-related nuclear cataract, bilateral: Secondary | ICD-10-CM | POA: Diagnosis not present

## 2021-12-26 DIAGNOSIS — H1045 Other chronic allergic conjunctivitis: Secondary | ICD-10-CM | POA: Diagnosis not present

## 2021-12-26 DIAGNOSIS — G43809 Other migraine, not intractable, without status migrainosus: Secondary | ICD-10-CM | POA: Diagnosis not present

## 2022-01-07 ENCOUNTER — Ambulatory Visit
Admission: RE | Admit: 2022-01-07 | Discharge: 2022-01-07 | Disposition: A | Discharge status: Home / Self Care | Payer: Medicare Other | Source: Ambulatory Visit

## 2022-01-07 DIAGNOSIS — Z87891 Personal history of nicotine dependence: Secondary | ICD-10-CM

## 2022-01-07 DIAGNOSIS — F17210 Nicotine dependence, cigarettes, uncomplicated: Secondary | ICD-10-CM | POA: Diagnosis not present

## 2022-01-09 ENCOUNTER — Other Ambulatory Visit: Payer: Self-pay

## 2022-01-09 DIAGNOSIS — Z87891 Personal history of nicotine dependence: Secondary | ICD-10-CM

## 2022-01-09 DIAGNOSIS — F17210 Nicotine dependence, cigarettes, uncomplicated: Secondary | ICD-10-CM

## 2022-01-11 DIAGNOSIS — R051 Acute cough: Secondary | ICD-10-CM | POA: Diagnosis not present

## 2022-01-11 DIAGNOSIS — R0981 Nasal congestion: Secondary | ICD-10-CM | POA: Diagnosis not present

## 2022-01-11 DIAGNOSIS — R509 Fever, unspecified: Secondary | ICD-10-CM | POA: Diagnosis not present

## 2022-01-11 DIAGNOSIS — J069 Acute upper respiratory infection, unspecified: Secondary | ICD-10-CM | POA: Diagnosis not present

## 2022-01-15 DIAGNOSIS — R0981 Nasal congestion: Secondary | ICD-10-CM | POA: Diagnosis not present

## 2022-01-15 DIAGNOSIS — J069 Acute upper respiratory infection, unspecified: Secondary | ICD-10-CM | POA: Diagnosis not present

## 2022-01-15 DIAGNOSIS — R059 Cough, unspecified: Secondary | ICD-10-CM | POA: Diagnosis not present

## 2022-02-09 ENCOUNTER — Emergency Department (HOSPITAL_COMMUNITY): Payer: Medicare Other

## 2022-02-09 ENCOUNTER — Other Ambulatory Visit: Payer: Self-pay

## 2022-02-09 ENCOUNTER — Encounter (HOSPITAL_COMMUNITY): Payer: Self-pay

## 2022-02-09 ENCOUNTER — Emergency Department (HOSPITAL_COMMUNITY)
Admission: EM | Admit: 2022-02-09 | Discharge: 2022-02-09 | Disposition: A | Discharge status: Home / Self Care | Payer: Medicare Other | Attending: Emergency Medicine | Admitting: Emergency Medicine

## 2022-02-09 DIAGNOSIS — W228XXA Striking against or struck by other objects, initial encounter: Secondary | ICD-10-CM | POA: Diagnosis not present

## 2022-02-09 DIAGNOSIS — S6991XA Unspecified injury of right wrist, hand and finger(s), initial encounter: Secondary | ICD-10-CM | POA: Diagnosis present

## 2022-02-09 DIAGNOSIS — S61214A Laceration without foreign body of right ring finger without damage to nail, initial encounter: Secondary | ICD-10-CM | POA: Diagnosis not present

## 2022-02-09 DIAGNOSIS — S67194A Crushing injury of right ring finger, initial encounter: Secondary | ICD-10-CM | POA: Diagnosis not present

## 2022-02-09 DIAGNOSIS — Z720 Tobacco use: Secondary | ICD-10-CM | POA: Diagnosis not present

## 2022-02-09 MED ORDER — LIDOCAINE-EPINEPHRINE (PF) 2 %-1:200000 IJ SOLN
10.0000 mL | Freq: Once | INTRAMUSCULAR | Status: AC
  Administered 2022-02-09: 10 mL via INTRADERMAL
  Filled 2022-02-09: qty 20
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
## 2022-02-09 NOTE — Discharge Instructions (Addendum)
[place]:  Thank you for allowing [place] to take care of you today.  We hope you begin feeling better soon. You were seen today for an injury to your finger.  You were found to have a large laceration which was repaired.  X-rays were performed which showed no signs of a fracture.  Please have the stitches removed in around 7 days, they can be pulled out by your primary doctor, in urgent care, or the emergency department.  As we discussed, keep an eye out to make sure it does not get infected.  To-Do:  Please follow-up with your primary doctor within the next 2-3 days. It is important that you review any labs or imaging results (if any) that you had today with them. Your preliminary imaging results (if any) are attached. Please return to the Emergency Department or call 911 if you experience chest pain, shortness of breath, severe pain, severe fever, altered mental status, or have any reason to think that you need emergency medical care.  Thank you again.  Hope you feel better soon.  Phyllis Ginger, MD Department of Emergency Medicine

## 2022-02-11 DIAGNOSIS — Z1231 Encounter for screening mammogram for malignant neoplasm of breast: Secondary | ICD-10-CM | POA: Diagnosis not present

## 2022-02-15 DIAGNOSIS — S61214D Laceration without foreign body of right ring finger without damage to nail, subsequent encounter: Secondary | ICD-10-CM | POA: Diagnosis not present

## 2022-02-18 DIAGNOSIS — S61204D Unspecified open wound of right ring finger without damage to nail, subsequent encounter: Secondary | ICD-10-CM | POA: Diagnosis not present

## 2022-02-18 DIAGNOSIS — Z4802 Encounter for removal of sutures: Secondary | ICD-10-CM | POA: Diagnosis not present

## 2022-03-06 DIAGNOSIS — L4050 Arthropathic psoriasis, unspecified: Secondary | ICD-10-CM | POA: Diagnosis not present

## 2022-03-06 DIAGNOSIS — L409 Psoriasis, unspecified: Secondary | ICD-10-CM | POA: Diagnosis not present

## 2022-03-06 DIAGNOSIS — M8580 Other specified disorders of bone density and structure, unspecified site: Secondary | ICD-10-CM | POA: Diagnosis not present

## 2022-03-06 DIAGNOSIS — M0600 Rheumatoid arthritis without rheumatoid factor, unspecified site: Secondary | ICD-10-CM | POA: Diagnosis not present

## 2022-03-06 DIAGNOSIS — M25512 Pain in left shoulder: Secondary | ICD-10-CM | POA: Diagnosis not present

## 2022-03-06 DIAGNOSIS — M150 Primary generalized (osteo)arthritis: Secondary | ICD-10-CM | POA: Diagnosis not present

## 2022-03-06 DIAGNOSIS — K219 Gastro-esophageal reflux disease without esophagitis: Secondary | ICD-10-CM | POA: Diagnosis not present

## 2022-03-06 DIAGNOSIS — Z79899 Other long term (current) drug therapy: Secondary | ICD-10-CM | POA: Diagnosis not present

## 2022-03-06 DIAGNOSIS — M549 Dorsalgia, unspecified: Secondary | ICD-10-CM | POA: Diagnosis not present

## 2022-05-01 DIAGNOSIS — H2513 Age-related nuclear cataract, bilateral: Secondary | ICD-10-CM | POA: Diagnosis not present

## 2022-05-01 DIAGNOSIS — H401132 Primary open-angle glaucoma, bilateral, moderate stage: Secondary | ICD-10-CM | POA: Diagnosis not present

## 2022-06-11 DIAGNOSIS — M549 Dorsalgia, unspecified: Secondary | ICD-10-CM | POA: Diagnosis not present

## 2022-06-11 DIAGNOSIS — M150 Primary generalized (osteo)arthritis: Secondary | ICD-10-CM | POA: Diagnosis not present

## 2022-06-11 DIAGNOSIS — L409 Psoriasis, unspecified: Secondary | ICD-10-CM | POA: Diagnosis not present

## 2022-06-11 DIAGNOSIS — M8580 Other specified disorders of bone density and structure, unspecified site: Secondary | ICD-10-CM | POA: Diagnosis not present

## 2022-06-11 DIAGNOSIS — M0600 Rheumatoid arthritis without rheumatoid factor, unspecified site: Secondary | ICD-10-CM | POA: Diagnosis not present

## 2022-06-11 DIAGNOSIS — L4050 Arthropathic psoriasis, unspecified: Secondary | ICD-10-CM | POA: Diagnosis not present

## 2022-06-11 DIAGNOSIS — K219 Gastro-esophageal reflux disease without esophagitis: Secondary | ICD-10-CM | POA: Diagnosis not present

## 2022-06-11 DIAGNOSIS — Z79899 Other long term (current) drug therapy: Secondary | ICD-10-CM | POA: Diagnosis not present

## 2022-06-18 DIAGNOSIS — H409 Unspecified glaucoma: Secondary | ICD-10-CM | POA: Diagnosis not present

## 2022-06-18 DIAGNOSIS — M069 Rheumatoid arthritis, unspecified: Secondary | ICD-10-CM | POA: Diagnosis not present

## 2022-06-18 DIAGNOSIS — E782 Mixed hyperlipidemia: Secondary | ICD-10-CM | POA: Diagnosis not present

## 2022-06-18 DIAGNOSIS — I10 Essential (primary) hypertension: Secondary | ICD-10-CM | POA: Diagnosis not present

## 2022-06-18 DIAGNOSIS — J449 Chronic obstructive pulmonary disease, unspecified: Secondary | ICD-10-CM | POA: Diagnosis not present

## 2022-06-18 DIAGNOSIS — K219 Gastro-esophageal reflux disease without esophagitis: Secondary | ICD-10-CM | POA: Diagnosis not present

## 2022-06-18 DIAGNOSIS — M8580 Other specified disorders of bone density and structure, unspecified site: Secondary | ICD-10-CM | POA: Diagnosis not present

## 2022-06-18 DIAGNOSIS — I2510 Atherosclerotic heart disease of native coronary artery without angina pectoris: Secondary | ICD-10-CM | POA: Diagnosis not present

## 2022-06-19 DIAGNOSIS — L821 Other seborrheic keratosis: Secondary | ICD-10-CM | POA: Diagnosis not present

## 2022-09-04 DIAGNOSIS — H1045 Other chronic allergic conjunctivitis: Secondary | ICD-10-CM | POA: Diagnosis not present

## 2022-09-04 DIAGNOSIS — H401132 Primary open-angle glaucoma, bilateral, moderate stage: Secondary | ICD-10-CM | POA: Diagnosis not present

## 2022-09-16 IMAGING — CT CT CHEST LUNG CANCER SCREENING LOW DOSE W/O CM
2 of 5 series · 15 of 40 positions shown, 18 images · non-contrast
Comparison: Chest CT 10/02/2018.

CLINICAL DATA: 65-year-old female current smoker with 50 pack-year
history of smoking. Lung cancer screening examination.

EXAM:
CT CHEST WITHOUT CONTRAST LOW-DOSE FOR LUNG CANCER SCREENING
TECHNIQUE: Multidetector CT imaging of the chest was performed following the
standard protocol without IV contrast.

[Series 4: lung 1.00 br44 cor · coronal · 0.59mm/px · 3 of 249 slices shown]
[im 50/249  lung]
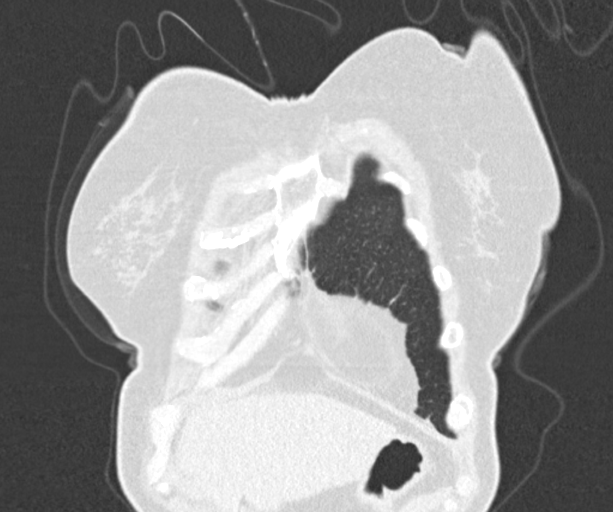
[im 100/249  lung]
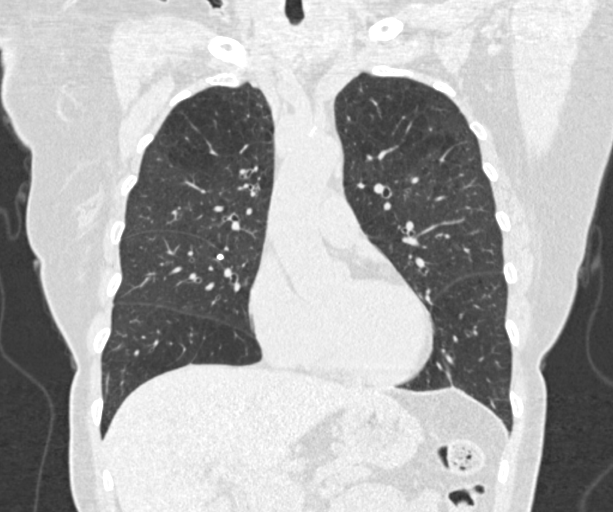
[im 149/249  lung]
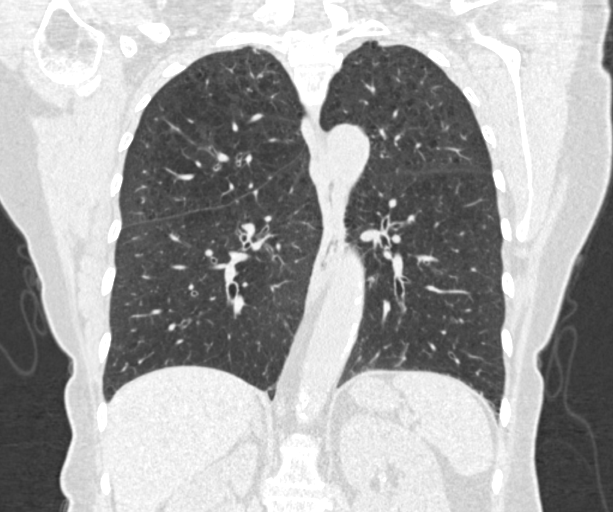

[Series 9: lung 1.00 br60 axial · axial · 0.71mm/px · z∈[-1172,-928]mm · 12 of 270 slices shown, 15 images]
[im 13/270  mediastinal]
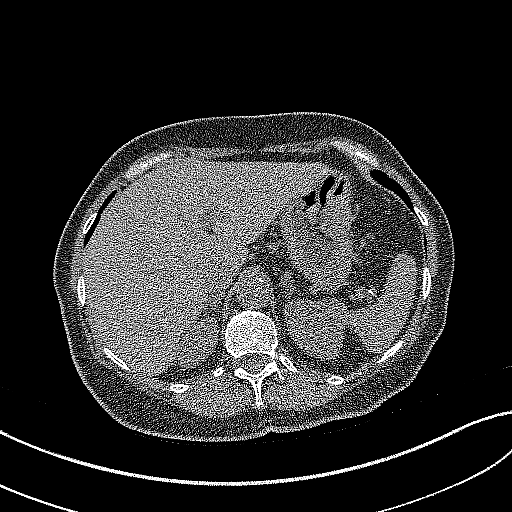
[im 13/270  lung]
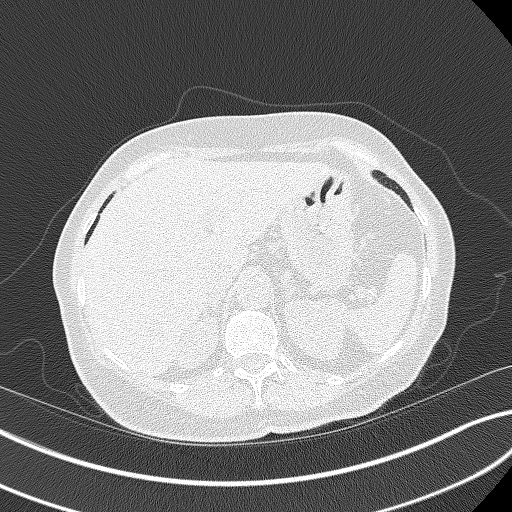
[im 37/270  lung]
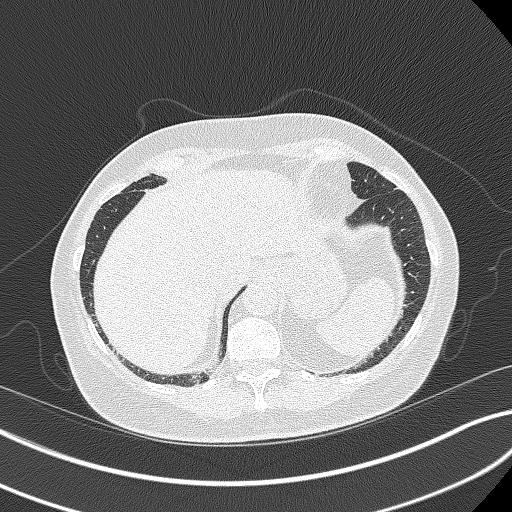
[im 62/270  lung]
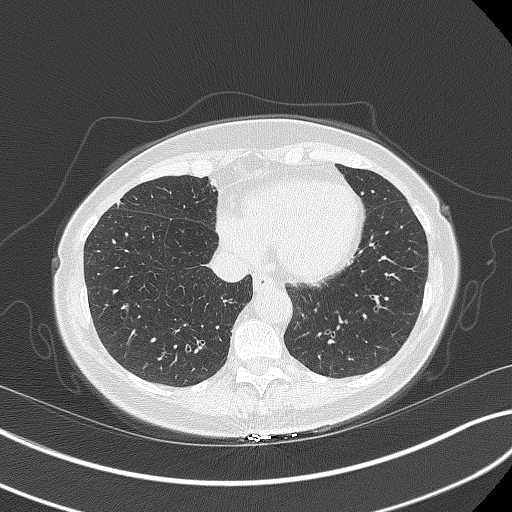
[im 86/270  lung]
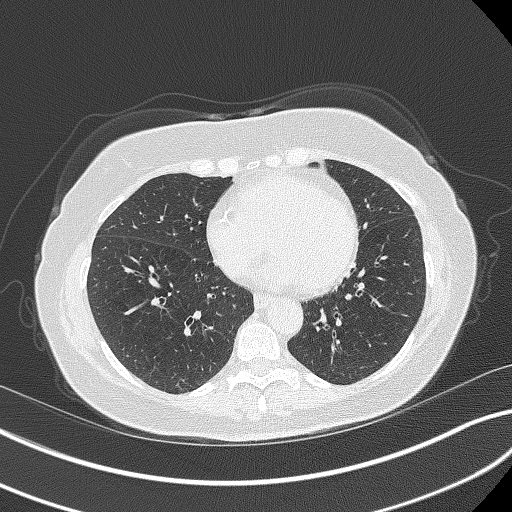
[im 98/270  mediastinal]
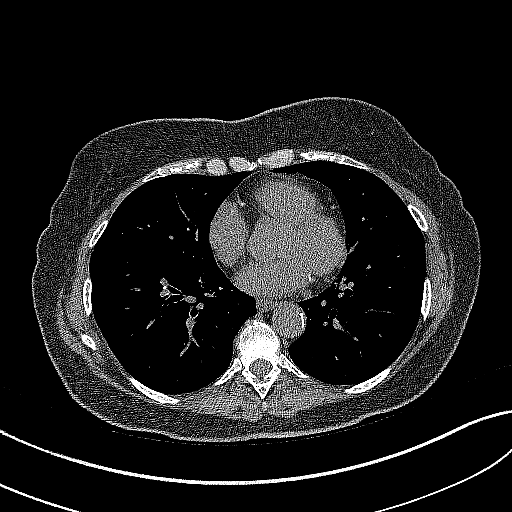
[im 98/270  lung]
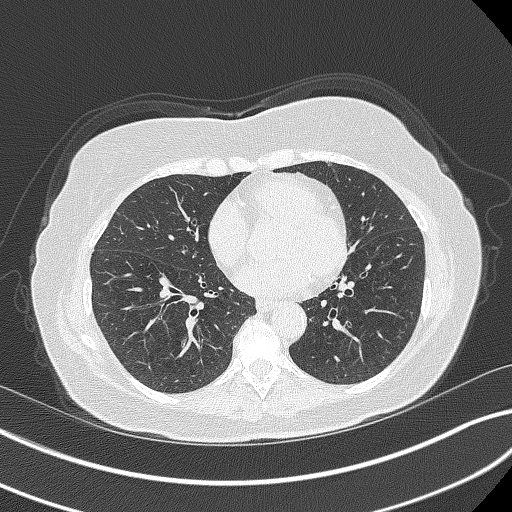
[im 123/270  lung]
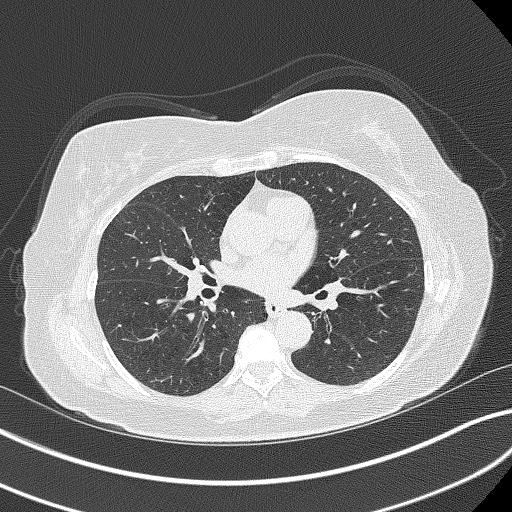
[im 147/270  lung]
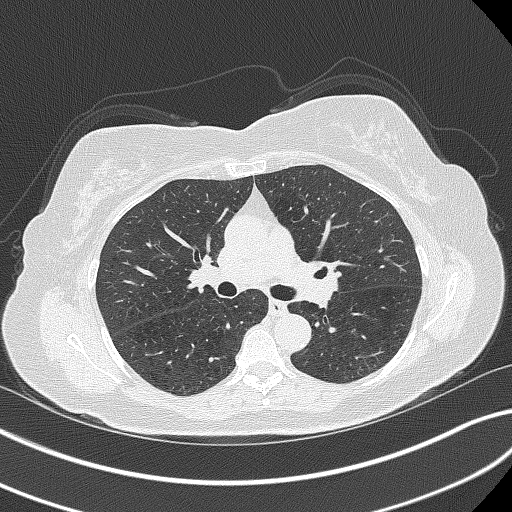
[im 172/270  lung]
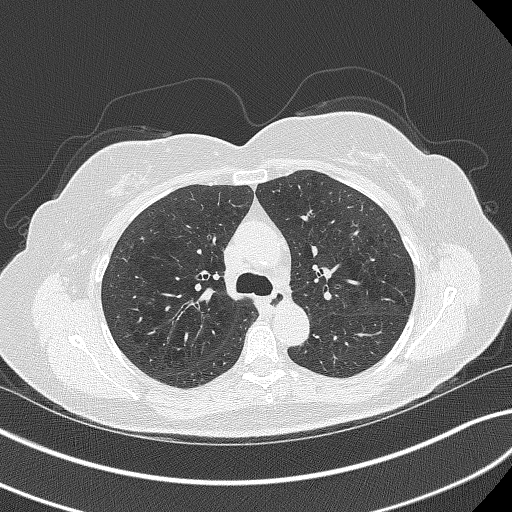
[im 184/270  mediastinal]
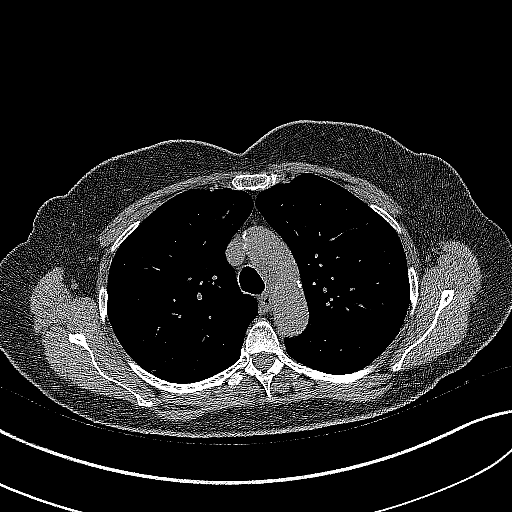
[im 184/270  lung]
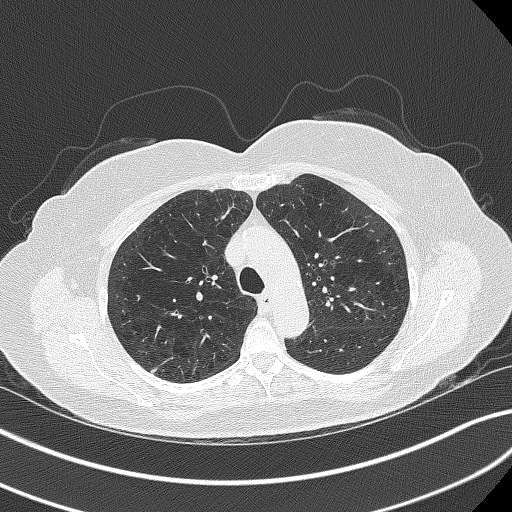
[im 208/270  lung]
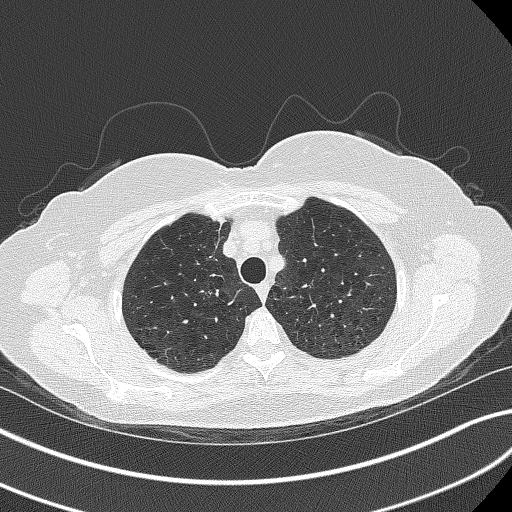
[im 233/270  lung]
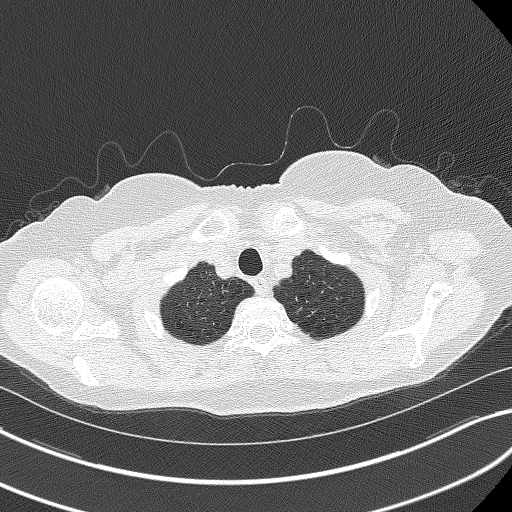
[im 257/270  lung]
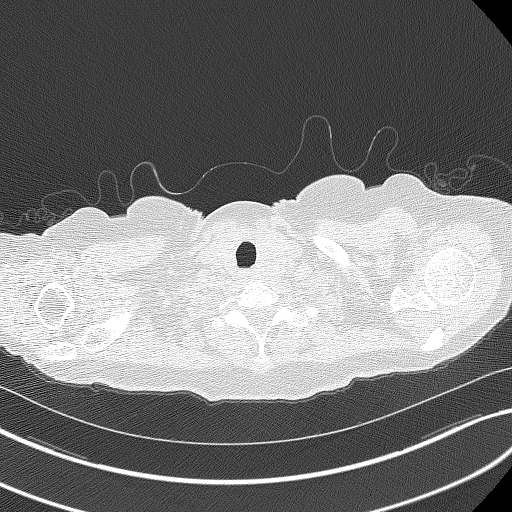

[15 of 40 positions shown; findings below may reference images not displayed]

FINDINGS: Cardiovascular: Heart size is normal. There is no significant
pericardial fluid, thickening or pericardial calcification. There is
aortic atherosclerosis, as well as atherosclerosis of the great
vessels of the mediastinum and the coronary arteries, including
calcified atherosclerotic plaque in the left main, left anterior
descending, left circumflex and right coronary arteries.

Mediastinum/Nodes: No pathologically enlarged mediastinal or hilar
lymph nodes. Esophagus is unremarkable in appearance. No axillary
lymphadenopathy.

Lungs/Pleura: Small pulmonary nodule in the base of the left lower
lobe (axial image 43 of series 3), with a volume derived mean
diameter of 4.6 mm. No larger more suspicious appearing pulmonary
nodules or masses are noted. No acute consolidative airspace
disease. No pleural effusions. Diffuse bronchial wall thickening
with moderate centrilobular and paraseptal emphysema.

Upper Abdomen: Aortic atherosclerosis.

Musculoskeletal: There are no aggressive appearing lytic or blastic
lesions noted in the visualized portions of the skeleton.
IMPRESSION: 1. Lung-RADS 2S, benign appearance or behavior. Continue annual
screening with low-dose chest CT without contrast in 12 months.
2. The "S" modifier above refers to potentially clinically
significant non lung cancer related findings. Specifically, there is
aortic atherosclerosis, in addition to left main and 3 vessel
coronary artery disease. Please note that although the presence of
coronary artery calcium documents the presence of coronary artery
disease, the severity of this disease and any potential stenosis
cannot be assessed on this non-gated CT examination. Assessment for
potential risk factor modification, dietary therapy or pharmacologic
therapy may be warranted, if clinically indicated.
3. Mild diffuse bronchial wall thickening with moderate
centrilobular and paraseptal emphysema; imaging findings suggestive
of underlying COPD.

Aortic Atherosclerosis (R077E-87F.F) and Emphysema (R077E-09Q.8).

## 2022-09-17 DIAGNOSIS — L409 Psoriasis, unspecified: Secondary | ICD-10-CM | POA: Diagnosis not present

## 2022-09-17 DIAGNOSIS — K219 Gastro-esophageal reflux disease without esophagitis: Secondary | ICD-10-CM | POA: Diagnosis not present

## 2022-09-17 DIAGNOSIS — L4050 Arthropathic psoriasis, unspecified: Secondary | ICD-10-CM | POA: Diagnosis not present

## 2022-09-17 DIAGNOSIS — M8580 Other specified disorders of bone density and structure, unspecified site: Secondary | ICD-10-CM | POA: Diagnosis not present

## 2022-09-17 DIAGNOSIS — M0600 Rheumatoid arthritis without rheumatoid factor, unspecified site: Secondary | ICD-10-CM | POA: Diagnosis not present

## 2022-09-17 DIAGNOSIS — M549 Dorsalgia, unspecified: Secondary | ICD-10-CM | POA: Diagnosis not present

## 2022-09-17 DIAGNOSIS — M150 Primary generalized (osteo)arthritis: Secondary | ICD-10-CM | POA: Diagnosis not present

## 2022-09-17 DIAGNOSIS — Z79899 Other long term (current) drug therapy: Secondary | ICD-10-CM | POA: Diagnosis not present

## 2022-10-30 DIAGNOSIS — M069 Rheumatoid arthritis, unspecified: Secondary | ICD-10-CM | POA: Diagnosis not present

## 2022-10-30 DIAGNOSIS — I10 Essential (primary) hypertension: Secondary | ICD-10-CM | POA: Diagnosis not present

## 2022-10-30 DIAGNOSIS — R7303 Prediabetes: Secondary | ICD-10-CM | POA: Diagnosis not present

## 2022-10-30 DIAGNOSIS — E041 Nontoxic single thyroid nodule: Secondary | ICD-10-CM | POA: Diagnosis not present

## 2022-10-30 DIAGNOSIS — D84821 Immunodeficiency due to drugs: Secondary | ICD-10-CM | POA: Diagnosis not present

## 2022-10-30 DIAGNOSIS — J449 Chronic obstructive pulmonary disease, unspecified: Secondary | ICD-10-CM | POA: Diagnosis not present

## 2022-10-30 DIAGNOSIS — I700 Atherosclerosis of aorta: Secondary | ICD-10-CM | POA: Diagnosis not present

## 2022-10-30 DIAGNOSIS — E782 Mixed hyperlipidemia: Secondary | ICD-10-CM | POA: Diagnosis not present

## 2022-10-30 DIAGNOSIS — M8580 Other specified disorders of bone density and structure, unspecified site: Secondary | ICD-10-CM | POA: Diagnosis not present

## 2022-10-30 DIAGNOSIS — Z0001 Encounter for general adult medical examination with abnormal findings: Secondary | ICD-10-CM | POA: Diagnosis not present

## 2022-10-30 DIAGNOSIS — I2510 Atherosclerotic heart disease of native coronary artery without angina pectoris: Secondary | ICD-10-CM | POA: Diagnosis not present

## 2022-11-01 ENCOUNTER — Other Ambulatory Visit: Payer: Self-pay | Admitting: Family Medicine

## 2022-11-01 DIAGNOSIS — I739 Peripheral vascular disease, unspecified: Secondary | ICD-10-CM

## 2022-11-01 DIAGNOSIS — E042 Nontoxic multinodular goiter: Secondary | ICD-10-CM | POA: Diagnosis not present

## 2022-11-01 DIAGNOSIS — E041 Nontoxic single thyroid nodule: Secondary | ICD-10-CM | POA: Diagnosis not present

## 2022-11-06 ENCOUNTER — Ambulatory Visit
Admission: RE | Admit: 2022-11-06 | Discharge: 2022-11-06 | Disposition: A | Discharge status: Home / Self Care | Payer: Medicare Other | Source: Ambulatory Visit | Attending: Family Medicine | Admitting: Family Medicine

## 2022-11-06 ENCOUNTER — Other Ambulatory Visit: Payer: Self-pay | Admitting: Family Medicine

## 2022-11-06 DIAGNOSIS — I739 Peripheral vascular disease, unspecified: Secondary | ICD-10-CM

## 2022-11-07 DIAGNOSIS — Z7952 Long term (current) use of systemic steroids: Secondary | ICD-10-CM | POA: Diagnosis not present

## 2022-11-07 DIAGNOSIS — M069 Rheumatoid arthritis, unspecified: Secondary | ICD-10-CM | POA: Diagnosis not present

## 2022-11-07 DIAGNOSIS — M8588 Other specified disorders of bone density and structure, other site: Secondary | ICD-10-CM | POA: Diagnosis not present

## 2023-01-06 DIAGNOSIS — H2513 Age-related nuclear cataract, bilateral: Secondary | ICD-10-CM | POA: Diagnosis not present

## 2023-01-06 DIAGNOSIS — H401132 Primary open-angle glaucoma, bilateral, moderate stage: Secondary | ICD-10-CM | POA: Diagnosis not present

## 2023-01-06 DIAGNOSIS — H1045 Other chronic allergic conjunctivitis: Secondary | ICD-10-CM | POA: Diagnosis not present

## 2023-01-06 DIAGNOSIS — G43809 Other migraine, not intractable, without status migrainosus: Secondary | ICD-10-CM | POA: Diagnosis not present

## 2023-01-09 ENCOUNTER — Ambulatory Visit
Admission: RE | Admit: 2023-01-09 | Discharge: 2023-01-09 | Disposition: A | Discharge status: Home / Self Care | Payer: Medicare Other | Source: Ambulatory Visit | Attending: Acute Care | Admitting: Acute Care

## 2023-01-09 DIAGNOSIS — Z87891 Personal history of nicotine dependence: Secondary | ICD-10-CM

## 2023-01-09 DIAGNOSIS — F17210 Nicotine dependence, cigarettes, uncomplicated: Secondary | ICD-10-CM

## 2023-01-23 ENCOUNTER — Other Ambulatory Visit: Payer: Self-pay | Admitting: Acute Care

## 2023-01-23 DIAGNOSIS — F17210 Nicotine dependence, cigarettes, uncomplicated: Secondary | ICD-10-CM

## 2023-01-23 DIAGNOSIS — Z87891 Personal history of nicotine dependence: Secondary | ICD-10-CM

## 2023-01-23 DIAGNOSIS — Z122 Encounter for screening for malignant neoplasm of respiratory organs: Secondary | ICD-10-CM

## 2023-02-11 DIAGNOSIS — M0600 Rheumatoid arthritis without rheumatoid factor, unspecified site: Secondary | ICD-10-CM | POA: Diagnosis not present

## 2023-02-11 DIAGNOSIS — L409 Psoriasis, unspecified: Secondary | ICD-10-CM | POA: Diagnosis not present

## 2023-02-11 DIAGNOSIS — M7060 Trochanteric bursitis, unspecified hip: Secondary | ICD-10-CM | POA: Diagnosis not present

## 2023-02-11 DIAGNOSIS — K219 Gastro-esophageal reflux disease without esophagitis: Secondary | ICD-10-CM | POA: Diagnosis not present

## 2023-02-11 DIAGNOSIS — Z79899 Other long term (current) drug therapy: Secondary | ICD-10-CM | POA: Diagnosis not present

## 2023-02-11 DIAGNOSIS — M150 Primary generalized (osteo)arthritis: Secondary | ICD-10-CM | POA: Diagnosis not present

## 2023-02-11 DIAGNOSIS — M25551 Pain in right hip: Secondary | ICD-10-CM | POA: Diagnosis not present

## 2023-02-11 DIAGNOSIS — L4050 Arthropathic psoriasis, unspecified: Secondary | ICD-10-CM | POA: Diagnosis not present

## 2023-02-11 DIAGNOSIS — M25552 Pain in left hip: Secondary | ICD-10-CM | POA: Diagnosis not present

## 2023-02-11 DIAGNOSIS — M549 Dorsalgia, unspecified: Secondary | ICD-10-CM | POA: Diagnosis not present

## 2023-02-11 DIAGNOSIS — M8580 Other specified disorders of bone density and structure, unspecified site: Secondary | ICD-10-CM | POA: Diagnosis not present

## 2023-02-17 ENCOUNTER — Other Ambulatory Visit: Payer: Self-pay

## 2023-02-17 MED ORDER — ROSUVASTATIN CALCIUM 20 MG PO TABS: 20.0000 mg | ORAL_TABLET | Freq: Every day | ORAL | 0 refills | Status: DC

## 2023-02-24 DIAGNOSIS — Z1231 Encounter for screening mammogram for malignant neoplasm of breast: Secondary | ICD-10-CM | POA: Diagnosis not present

## 2023-02-25 ENCOUNTER — Other Ambulatory Visit: Payer: Self-pay | Admitting: Internal Medicine

## 2023-03-09 NOTE — Progress Notes (Unsigned)
   Cardiology Office Note    Date:  03/09/2023  ID:  Katelyn Bell, DOB 1955/09/23, MRN [id number] PCP:  Lupita Raider, MD  Cardiologist:  Parke Poisson, MD  Electrophysiologist:  None   Chief Complaint: ***  History of Present Illness: .    Katelyn Bell is a 69 y.o. female with visit-pertinent history of GERD, glaucoma, tobacco use and rheumatoid arthritis. She has a strong family history of CAD.  In 2019 patient underwent CT coronary calcium study which revealed a calcium score of 301 which placed her in the 95th percentile for age and gender matched control.  On 11/27/2017 she underwent echo stress test that indicated LVEF of 60 to 65%, there were no stress arrhythmias or conduction abnormalities, stress ECG was normal.  Noted to be normal study after maximal exercise.  She was last seen via televideo on 12/19/2020, she had remained stable from a cardiac standpoint.  She was recovering from bronchitis associated with RSV infection.  Elevated coronary calcium score: CT coronary calcium study in 2019 indicated a calcium score of 301 which at that time placed in the 95th percentile.  Patient had stress echo that was normal in 10/2017.  Today she reports  Tobacco use:  Hyperlipidemia:  Labwork independently reviewed:   ROS: .   *** denies chest pain, shortness of breath, lower extremity edema, fatigue, palpitations, melena, hematuria, hemoptysis, diaphoresis, weakness, presyncope, syncope, orthopnea, and PND.  All other systems are reviewed and otherwise negative.  Studies Reviewed: Marland Kitchen    EKG:  EKG is ordered today, personally reviewed, demonstrating ***     CV Studies: Cardiac studies reviewed are outlined and summarized above. Otherwise please see EMR for full report.   Current Reported Medications:.    No outpatient medications have been marked as taking for the 03/12/23 encounter (Appointment) with Rip Harbour, NP.    Physical Exam:    VS:  There were no  vitals taken for this visit.   Wt Readings from Last 3 Encounters:  02/09/22 130 lb 1.1 oz (59 kg)  12/19/20 130 lb (59 kg)  12/08/19 136 lb 9.6 oz (62 kg)    GEN: Well nourished, well developed in no acute distress NECK: No JVD; No carotid bruits CARDIAC: ***RRR, no murmurs, rubs, gallops RESPIRATORY:  Clear to auscultation without rales, wheezing or rhonchi  ABDOMEN: Soft, non-tender, non-distended EXTREMITIES:  No edema; No acute deformity   Asessement and Plan:.     ***     Disposition: F/u with ***  Signed, Rip Harbour, NP

## 2023-03-12 ENCOUNTER — Ambulatory Visit: Payer: Medicare Other | Attending: Cardiology | Admitting: Cardiology

## 2023-03-12 ENCOUNTER — Encounter: Payer: Self-pay | Admitting: Cardiology

## 2023-03-12 VITALS — BP 116/80 | HR 74 | Ht 60.0 in | Wt 151.0 lb

## 2023-03-12 DIAGNOSIS — J449 Chronic obstructive pulmonary disease, unspecified: Secondary | ICD-10-CM

## 2023-03-12 DIAGNOSIS — R931 Abnormal findings on diagnostic imaging of heart and coronary circulation: Secondary | ICD-10-CM

## 2023-03-12 DIAGNOSIS — E782 Mixed hyperlipidemia: Secondary | ICD-10-CM | POA: Diagnosis not present

## 2023-03-12 DIAGNOSIS — Z720 Tobacco use: Secondary | ICD-10-CM

## 2023-03-12 MED ORDER — ROSUVASTATIN CALCIUM 20 MG PO TABS: 20.0000 mg | ORAL_TABLET | Freq: Every day | ORAL | 3 refills | Status: DC

## 2023-03-12 NOTE — Patient Instructions (Addendum)
Medication Instructions:  Start Aspirin 81 mg once a day *If you need a refill on your cardiac medications before your next appointment, please call your pharmacy*  Lab Work: No labs If you have labs (blood work) drawn today and your tests are completely normal, you will receive your results only by: MyChart Message (if you have MyChart) OR A paper copy in the mail If you have any lab test that is abnormal or we need to change your treatment, we will call you to review the results.  Testing/Procedures: No testing  Follow-Up: At [facility], you and your health needs are our priority.  As part of our continuing mission to provide you with exceptional heart care, we have created designated Provider Care Teams.  These Care Teams include your primary Cardiologist (physician) and Advanced Practice Providers (APPs -  Physician Assistants and Nurse Practitioners) who all work together to provide you with the care you need, when you need it.  We recommend signing up for the patient portal called "MyChart".  Sign up information is provided on this After Visit Summary.  MyChart is used to connect with patients for Virtual Visits (Telemedicine).  Patients are able to view lab/test results, encounter notes, upcoming appointments, etc.  Non-urgent messages can be sent to your provider as well.   To learn more about what you can do with MyChart, go to [email or web address].    Your next appointment:   1 year(s)  Provider:   Parke Poisson, MD  or Reather Littler, NP

## 2023-05-07 DIAGNOSIS — H401132 Primary open-angle glaucoma, bilateral, moderate stage: Secondary | ICD-10-CM | POA: Diagnosis not present

## 2023-05-12 DIAGNOSIS — Z79899 Other long term (current) drug therapy: Secondary | ICD-10-CM | POA: Diagnosis not present

## 2023-05-12 DIAGNOSIS — L4050 Arthropathic psoriasis, unspecified: Secondary | ICD-10-CM | POA: Diagnosis not present

## 2023-05-12 DIAGNOSIS — M150 Primary generalized (osteo)arthritis: Secondary | ICD-10-CM | POA: Diagnosis not present

## 2023-05-12 DIAGNOSIS — K219 Gastro-esophageal reflux disease without esophagitis: Secondary | ICD-10-CM | POA: Diagnosis not present

## 2023-05-12 DIAGNOSIS — M549 Dorsalgia, unspecified: Secondary | ICD-10-CM | POA: Diagnosis not present

## 2023-05-12 DIAGNOSIS — M0600 Rheumatoid arthritis without rheumatoid factor, unspecified site: Secondary | ICD-10-CM | POA: Diagnosis not present

## 2023-05-12 DIAGNOSIS — M8580 Other specified disorders of bone density and structure, unspecified site: Secondary | ICD-10-CM | POA: Diagnosis not present

## 2023-05-12 DIAGNOSIS — L409 Psoriasis, unspecified: Secondary | ICD-10-CM | POA: Diagnosis not present

## 2023-08-21 DIAGNOSIS — L4050 Arthropathic psoriasis, unspecified: Secondary | ICD-10-CM | POA: Diagnosis not present

## 2023-08-21 DIAGNOSIS — Z79899 Other long term (current) drug therapy: Secondary | ICD-10-CM | POA: Diagnosis not present

## 2023-08-21 DIAGNOSIS — M8580 Other specified disorders of bone density and structure, unspecified site: Secondary | ICD-10-CM | POA: Diagnosis not present

## 2023-08-21 DIAGNOSIS — M0600 Rheumatoid arthritis without rheumatoid factor, unspecified site: Secondary | ICD-10-CM | POA: Diagnosis not present

## 2023-08-31 DIAGNOSIS — S93402A Sprain of unspecified ligament of left ankle, initial encounter: Secondary | ICD-10-CM | POA: Diagnosis not present

## 2023-08-31 DIAGNOSIS — S62347A Nondisplaced fracture of base of fifth metacarpal bone. left hand, initial encounter for closed fracture: Secondary | ICD-10-CM | POA: Diagnosis not present

## 2023-08-31 DIAGNOSIS — M79672 Pain in left foot: Secondary | ICD-10-CM | POA: Diagnosis not present

## 2023-08-31 DIAGNOSIS — S92355A Nondisplaced fracture of fifth metatarsal bone, left foot, initial encounter for closed fracture: Secondary | ICD-10-CM | POA: Diagnosis not present

## 2023-09-01 DIAGNOSIS — M25572 Pain in left ankle and joints of left foot: Secondary | ICD-10-CM | POA: Diagnosis not present

## 2023-09-03 ENCOUNTER — Ambulatory Visit: Attending: Physician Assistant

## 2023-09-03 ENCOUNTER — Telehealth: Payer: Self-pay

## 2023-09-03 DIAGNOSIS — Z01810 Encounter for preprocedural cardiovascular examination: Secondary | ICD-10-CM

## 2023-09-03 NOTE — Telephone Encounter (Signed)
 Patient not on anticoagulation ?

## 2023-09-03 NOTE — Progress Notes (Signed)
 Virtual Visit via Telephone Note   Because of Katelyn Bell co-morbid illnesses, she is at least at moderate risk for complications without adequate follow up.  This format is felt to be most appropriate for this patient at this time.  Due to technical limitations with video connection (technology), today's appointment will be conducted as an audio only telehealth visit, and Katelyn Bell verbally agreed to proceed in this manner.   All issues noted in this document were discussed and addressed.  No physical exam could be performed with this format.  Evaluation Performed:  Preoperative cardiovascular risk assessment _____________   Date:  09/03/2023   Patient ID:  Katelyn Bell, DOB 11-Jun-1955, MRN [id number] Patient Location:  Home Provider location:   Office  Primary Care Provider:  Loreli Kins, Bell Primary Cardiologist:  Katelyn DELENA Merck, Bell  Chief Complaint / Patient Profile   68 y.o. y/o female with a h/o GERD, glaucoma, tobacco use and rheumatic arthritis who is pending ORIF left metatarsal fracture and presents today for telephonic preoperative cardiovascular risk assessment.  History of Present Illness    Katelyn Bell is a 68 y.o. female who presents via audio/video conferencing for a telehealth visit today.  Pt was last seen in cardiology clinic on 03/12/2023 by Katelyn West, NP.  At that time Katelyn Bell was doing well.  The patient is now pending procedure as outlined above. Since her last visit, she tells me she has not had any issues with chest pain or shortness of breath.  She is taking a baby aspirin  a day (81 mg).  She can hold this medication for 5 to 7 days prior to surgery.  She was also told she needs to hold her fish oil 5 to 7 days due to its blood thinning effects.  Past Medical History    Past Medical History:  Diagnosis Date   Fluid retention    GERD (gastroesophageal reflux disease)    Glaucoma    Osteoarthritis    Rheumatoid  arthritis(714.0)    Past Surgical History:  Procedure Laterality Date   CARPAL TUNNEL RELEASE  ~ 2 years   bilateral   KNEE ARTHROSCOPY  07/17/11   right   PARTIAL HYSTERECTOMY     TONSILLECTOMY     TOTAL KNEE ARTHROPLASTY  09/11/2011   Procedure: TOTAL KNEE ARTHROPLASTY;  Surgeon: Katelyn Bell;  Location: [facility] OR;  Service: Orthopedics;  Laterality: Right;  Right total knee Arthroplasty    Allergies  Allergies  Allergen Reactions   Clindamycin/Lincomycin Diarrhea   Penicillins Hives    Has patient had a PCN reaction causing immediate rash, facial/tongue/throat swelling, SOB or lightheadedness with hypotension: no Has patient had a PCN reaction causing severe rash involving mucus membranes or skin necrosis:no Has patient had a PCN reaction that required hospitalization: no Has patient had a PCN reaction occurring within the last 10 years: noIf all of the above answers are NO, then may proceed with Cephalosporin use.     Home Medications    Prior to Admission medications   Medication Sig Start Date End Date Taking? Authorizing Provider  Adalimumab 40 MG/0.8ML PSKT Inject 1 application into the skin every 14 (fourteen) days.     Provider, Historical, Bell  calcium  carbonate (OS-CAL) 1250 MG chewable tablet Chew 1 tablet by mouth daily.    Provider, Historical, Bell  Cetirizine HCl (ZYRTEC ALLERGY PO) Take by mouth.    Provider, Historical, Bell  diazepam (VALIUM) 5 MG tablet  Take 5 mg by mouth every 8 (eight) hours as needed for anxiety.  10/22/13   Provider, Historical, Bell  escitalopram (LEXAPRO) 10 MG tablet Take 10 mg by mouth at bedtime. 05/28/20   Provider, Historical, Bell  estradiol (ESTRACE) 1 MG tablet Take 1.5 mg by mouth daily.    Provider, Historical, Bell  fish oil-omega-3 fatty acids 1000 MG capsule Take 1 g by mouth daily.    Provider, Historical, Bell  folic acid  (FOLVITE ) 1 MG tablet Take 2 mg by mouth daily.    Provider, Historical, Bell  methotrexate  (RHEUMATREX) 2.5 MG  tablet Take 2.5 mg by mouth once a week. Caution:Chemotherapy. Protect from light.     Provider, Historical, Bell  montelukast (SINGULAIR) 10 MG tablet Take 10 mg by mouth at bedtime.    Provider, Historical, Bell  mupirocin  ointment (BACTROBAN ) 2 % Apply to wound twice a day. 04/18/15   Katelyn Bell  omeprazole (PRILOSEC) 20 MG capsule Take 20 mg by mouth daily.    Provider, Historical, Bell  ondansetron  (ZOFRAN ) 4 MG tablet Take 1 tablet (4 mg total) by mouth every 8 (eight) hours as needed for nausea or vomiting. 01/14/18   Katelyn Bell  predniSONE (DELTASONE) 5 MG tablet Take 5 mg by mouth daily with breakfast.    Provider, Historical, Bell  risedronate (ACTONEL) 35 MG tablet Take 35 mg by mouth every 7 (seven) days. with water on empty stomach, nothing by mouth or lie down for next 30 minutes.    Provider, Historical, Bell  rosuvastatin  (CRESTOR ) 20 MG tablet Take 1 tablet (20 mg total) by mouth daily. 03/12/23   Bell, Katelyn D, NP  SYMBICORT 160-4.5 MCG/ACT inhaler Inhale 2 puffs into the lungs 2 (two) times daily.    Provider, Historical, Bell  vitamin C (ASCORBIC ACID) 500 MG tablet Take 1,000 mg by mouth daily.    Provider, Historical, Bell  vitamin E 200 UNIT capsule Take 200 Units by mouth daily.    Provider, Historical, Bell    Physical Exam    Vital Signs:  Katelyn Bell does not have vital signs available for review today.  Given telephonic nature of communication, physical exam is limited. AAOx3. NAD. Normal affect.  Speech and respirations are unlabored.  Accessory Clinical Findings    None  Assessment & Plan    1.  Preoperative Cardiovascular Risk Assessment:  Katelyn Bell perioperative risk of a major cardiac event is 0.4% according to the Revised Cardiac Risk Index (RCRI).  Therefore, she is at low risk for perioperative complications.   Her functional capacity is fair at 4.73 METs according to the Duke Activity Status Index (DASI). Recommendations: According to ACC/AHA  guidelines, no further cardiovascular testing needed.  The patient may proceed to surgery at acceptable risk.   Antiplatelet and/or Anticoagulation Recommendations: Aspirin  can be held for 5-7 days prior to her surgery.  Please resume Aspirin  post operatively when it is felt to be safe from a bleeding standpoint.   The patient was advised that if she develops new symptoms prior to surgery to contact our office to arrange for a follow-up visit, and she verbalized understanding.   A copy of this note will be routed to requesting surgeon.   Time:   Today, I have spent 5 minutes with the patient with telehealth technology discussing medical history, symptoms, and management plan.     Orren LOISE Fabry, PA-C  09/03/2023, 3:10 PM

## 2023-09-03 NOTE — Telephone Encounter (Signed)
   Pre-operative Risk Assessment    Patient Name: Katelyn Bell  DOB: 04-Oct-1955 MRN: [id number]   Date of last office visit: 03/12/23 ROSABEL MOSE, NP Date of next office visit: NONE   Request for Surgical Clearance    Procedure:  ORIF LEFT METATARSAL FRACTURE  Date of Surgery:  Clearance 09/04/23                                Surgeon:  EVALENE CHANCY, MD Surgeon's Group or Practice Name:  [facility] Phone number:  [phone]  EXT  [phone] Fax number:  [phone]   ATTN: KELLY HIGH   Type of Clearance Requested:   - Medical    Type of Anesthesia:  CHOICE   Additional requests/questions:    Bonney Lucie DELENA Alvia   09/03/2023, 9:36 AM

## 2023-09-03 NOTE — Telephone Encounter (Signed)
   Name: Katelyn Bell  DOB: [birth year]  MRN: [id number]  Primary Cardiologist: Soyla DELENA Merck, MD   Preoperative team, please contact this patient and set up a phone call appointment for further preoperative risk assessment. Please obtain consent and complete medication review. Surgery scheduled for 09/04/2023 Thank you for your help.  I confirm that guidance regarding antiplatelet and oral anticoagulation therapy has been completed and, if necessary, noted below.  Patient is not on anticoagulation or antiplatelet per review of medical record in Epic.    I also confirmed the patient resides in the state of [place] . As per [facility] Medical Board telemedicine laws, the patient must reside in the state in which the provider is licensed.   Lamarr Satterfield, NP 09/03/2023, 9:59 AM [facility]

## 2023-09-03 NOTE — Telephone Encounter (Signed)
 S/W pt and scheduled TELE Preop appt 09/03/23. Med Rec and Consent done

## 2023-09-03 NOTE — Telephone Encounter (Signed)
 Med Rec and Consent done     Patient Consent for Virtual Visit        Katelyn Bell has provided verbal consent on 09/03/2023 for a virtual visit (video or telephone).   CONSENT FOR VIRTUAL VISIT FOR:  Katelyn Bell  By participating in this virtual visit I agree to the following:  I hereby voluntarily request, consent and authorize [facility] and its employed or contracted physicians, physician assistants, nurse practitioners or other licensed health care professionals (the Practitioner), to provide me with telemedicine health care services (the "Services) as deemed necessary by the treating Practitioner. I acknowledge and consent to receive the Services by the Practitioner via telemedicine. I understand that the telemedicine visit will involve communicating with the Practitioner through live audiovisual communication technology and the disclosure of certain medical information by electronic transmission. I acknowledge that I have been given the opportunity to request an in-person assessment or other available alternative prior to the telemedicine visit and am voluntarily participating in the telemedicine visit.  I understand that I have the right to withhold or withdraw my consent to the use of telemedicine in the course of my care at any time, without affecting my right to future care or treatment, and that the Practitioner or I may terminate the telemedicine visit at any time. I understand that I have the right to inspect all information obtained and/or recorded in the course of the telemedicine visit and may receive copies of available information for a reasonable fee.  I understand that some of the potential risks of receiving the Services via telemedicine include:  Delay or interruption in medical evaluation due to technological equipment failure or disruption; Information transmitted may not be sufficient (e.g. poor resolution of images) to allow for appropriate medical  decision making by the Practitioner; and/or  In rare instances, security protocols could fail, causing a breach of personal health information.  Furthermore, I acknowledge that it is my responsibility to provide information about my medical history, conditions and care that is complete and accurate to the best of my ability. I acknowledge that Practitioner's advice, recommendations, and/or decision may be based on factors not within their control, such as incomplete or inaccurate data provided by me or distortions of diagnostic images or specimens that may result from electronic transmissions. I understand that the practice of medicine is not an exact science and that Practitioner makes no warranties or guarantees regarding treatment outcomes. I acknowledge that a copy of this consent can be made available to me via my patient portal [facility] MyChart), or I can request a printed copy by calling the office of [facility].    I understand that my insurance will be billed for this visit.   I have read or had this consent read to me. I understand the contents of this consent, which adequately explains the benefits and risks of the Services being provided via telemedicine.  I have been provided ample opportunity to ask questions regarding this consent and the Services and have had my questions answered to my satisfaction. I give my informed consent for the services to be provided through the use of telemedicine in my medical care

## 2023-09-03 NOTE — Telephone Encounter (Signed)
    Name: Katelyn Bell  DOB: [birth year]  MRN: [id number]  Primary Cardiologist: Soyla DELENA Merck, MD   Preoperative team, please contact this patient and set up a phone call appointment for further preoperative risk assessment. Please obtain consent and complete medication review. Surgery scheduled for 09/04/2023 Thank you for your help.  I confirm that guidance regarding antiplatelet and oral anticoagulation therapy has been completed and, if necessary, noted below.  Patient is not on anticoagulation or antiplatelet per review of medical record in Epic.    I also confirmed the patient resides in the state of [place] . As per [facility] Medical Board telemedicine laws, the patient must reside in the state in which the provider is licensed.   Lamarr Satterfield, NP 09/03/2023, 9:59 AM [facility]

## 2023-09-04 DIAGNOSIS — S92352A Displaced fracture of fifth metatarsal bone, left foot, initial encounter for closed fracture: Secondary | ICD-10-CM | POA: Diagnosis not present

## 2023-09-04 DIAGNOSIS — G8918 Other acute postprocedural pain: Secondary | ICD-10-CM | POA: Diagnosis not present

## 2023-09-15 DIAGNOSIS — S92352D Displaced fracture of fifth metatarsal bone, left foot, subsequent encounter for fracture with routine healing: Secondary | ICD-10-CM | POA: Diagnosis not present

## 2023-10-09 DIAGNOSIS — H401132 Primary open-angle glaucoma, bilateral, moderate stage: Secondary | ICD-10-CM | POA: Diagnosis not present

## 2023-10-09 DIAGNOSIS — H2513 Age-related nuclear cataract, bilateral: Secondary | ICD-10-CM | POA: Diagnosis not present

## 2023-10-09 DIAGNOSIS — H1045 Other chronic allergic conjunctivitis: Secondary | ICD-10-CM | POA: Diagnosis not present

## 2023-10-13 DIAGNOSIS — S92352D Displaced fracture of fifth metatarsal bone, left foot, subsequent encounter for fracture with routine healing: Secondary | ICD-10-CM | POA: Diagnosis not present

## 2023-10-17 DIAGNOSIS — R0981 Nasal congestion: Secondary | ICD-10-CM | POA: Diagnosis not present

## 2023-10-17 DIAGNOSIS — R059 Cough, unspecified: Secondary | ICD-10-CM | POA: Diagnosis not present

## 2023-10-31 DIAGNOSIS — M069 Rheumatoid arthritis, unspecified: Secondary | ICD-10-CM | POA: Diagnosis not present

## 2023-10-31 DIAGNOSIS — D84821 Immunodeficiency due to drugs: Secondary | ICD-10-CM | POA: Diagnosis not present

## 2023-10-31 DIAGNOSIS — J449 Chronic obstructive pulmonary disease, unspecified: Secondary | ICD-10-CM | POA: Diagnosis not present

## 2023-10-31 DIAGNOSIS — M8580 Other specified disorders of bone density and structure, unspecified site: Secondary | ICD-10-CM | POA: Diagnosis not present

## 2023-10-31 DIAGNOSIS — E041 Nontoxic single thyroid nodule: Secondary | ICD-10-CM | POA: Diagnosis not present

## 2023-10-31 DIAGNOSIS — Z23 Encounter for immunization: Secondary | ICD-10-CM | POA: Diagnosis not present

## 2023-10-31 DIAGNOSIS — Z0001 Encounter for general adult medical examination with abnormal findings: Secondary | ICD-10-CM | POA: Diagnosis not present

## 2023-10-31 DIAGNOSIS — R7303 Prediabetes: Secondary | ICD-10-CM | POA: Diagnosis not present

## 2023-10-31 DIAGNOSIS — E782 Mixed hyperlipidemia: Secondary | ICD-10-CM | POA: Diagnosis not present

## 2023-10-31 DIAGNOSIS — I2510 Atherosclerotic heart disease of native coronary artery without angina pectoris: Secondary | ICD-10-CM | POA: Diagnosis not present

## 2023-10-31 LAB — LAB REPORT - SCANNED: A1c: 6

## 2023-11-05 DIAGNOSIS — L570 Actinic keratosis: Secondary | ICD-10-CM | POA: Diagnosis not present

## 2023-11-05 DIAGNOSIS — L578 Other skin changes due to chronic exposure to nonionizing radiation: Secondary | ICD-10-CM | POA: Diagnosis not present

## 2023-11-05 DIAGNOSIS — L821 Other seborrheic keratosis: Secondary | ICD-10-CM | POA: Diagnosis not present

## 2023-11-05 DIAGNOSIS — L814 Other melanin hyperpigmentation: Secondary | ICD-10-CM | POA: Diagnosis not present

## 2023-11-05 DIAGNOSIS — C44722 Squamous cell carcinoma of skin of right lower limb, including hip: Secondary | ICD-10-CM | POA: Diagnosis not present

## 2023-11-17 DIAGNOSIS — C44722 Squamous cell carcinoma of skin of right lower limb, including hip: Secondary | ICD-10-CM | POA: Diagnosis not present

## 2023-11-21 DIAGNOSIS — E042 Nontoxic multinodular goiter: Secondary | ICD-10-CM | POA: Diagnosis not present

## 2023-11-21 DIAGNOSIS — E041 Nontoxic single thyroid nodule: Secondary | ICD-10-CM | POA: Diagnosis not present

## 2024-01-05 ENCOUNTER — Ambulatory Visit: Payer: Self-pay | Admitting: Internal Medicine

## 2024-01-12 ENCOUNTER — Inpatient Hospital Stay
Admission: RE | Admit: 2024-01-12 | Discharge: 2024-01-12 | Disposition: A | Source: Ambulatory Visit | Attending: Acute Care | Admitting: Acute Care

## 2024-01-12 DIAGNOSIS — Z87891 Personal history of nicotine dependence: Secondary | ICD-10-CM

## 2024-01-12 DIAGNOSIS — F17210 Nicotine dependence, cigarettes, uncomplicated: Secondary | ICD-10-CM

## 2024-01-12 DIAGNOSIS — Z122 Encounter for screening for malignant neoplasm of respiratory organs: Secondary | ICD-10-CM

## 2024-01-12 MED ORDER — IOPAMIDOL (ISOVUE-370) INJECTION 76%
75.0000 mL | Freq: Once | INTRAVENOUS | Status: AC | PRN
  Administered 2024-01-12: 09:00:00 75 mL via INTRAVENOUS

## 2024-01-16 ENCOUNTER — Other Ambulatory Visit: Payer: Self-pay

## 2024-01-16 DIAGNOSIS — Z87891 Personal history of nicotine dependence: Secondary | ICD-10-CM

## 2024-01-16 DIAGNOSIS — F17210 Nicotine dependence, cigarettes, uncomplicated: Secondary | ICD-10-CM

## 2024-01-16 DIAGNOSIS — Z122 Encounter for screening for malignant neoplasm of respiratory organs: Secondary | ICD-10-CM

## 2024-02-19 ENCOUNTER — Other Ambulatory Visit: Payer: Self-pay | Admitting: Cardiology

## 2024-02-25 NOTE — Telephone Encounter (Signed)
 Labs Completed on 10/31/23

## 2024-03-22 ENCOUNTER — Ambulatory Visit: Admitting: Cardiology
